# Patient Record
Sex: Female | Born: 1940 | Race: White | Hispanic: No | Smoking: Never smoker
Health system: Southern US, Community
[De-identification: ages and names within clinical notes are randomized; demographics above are authoritative.]

## PROBLEM LIST (undated history)

## (undated) DIAGNOSIS — G20A1 Parkinson's disease without dyskinesia, without mention of fluctuations: Secondary | ICD-10-CM

## (undated) DIAGNOSIS — R251 Tremor, unspecified: Secondary | ICD-10-CM

## (undated) DIAGNOSIS — C4491 Basal cell carcinoma of skin, unspecified: Secondary | ICD-10-CM

## (undated) DIAGNOSIS — I519 Heart disease, unspecified: Secondary | ICD-10-CM

## (undated) DIAGNOSIS — I10 Essential (primary) hypertension: Secondary | ICD-10-CM

## (undated) DIAGNOSIS — C50919 Malignant neoplasm of unspecified site of unspecified female breast: Secondary | ICD-10-CM

## (undated) DIAGNOSIS — Z923 Personal history of irradiation: Secondary | ICD-10-CM

## (undated) DIAGNOSIS — M5124 Other intervertebral disc displacement, thoracic region: Secondary | ICD-10-CM

## (undated) DIAGNOSIS — G20 Parkinson's disease: Secondary | ICD-10-CM

## (undated) DIAGNOSIS — I38 Endocarditis, valve unspecified: Secondary | ICD-10-CM

## (undated) HISTORY — DX: Essential (primary) hypertension: I10

## (undated) HISTORY — PX: TONSILLECTOMY: SUR1361

## (undated) HISTORY — DX: Tremor, unspecified: R25.1

## (undated) HISTORY — PX: BASAL CELL CARCINOMA EXCISION: SHX1214

## (undated) HISTORY — DX: Other intervertebral disc displacement, thoracic region: M51.24

## (undated) HISTORY — DX: Parkinson's disease without dyskinesia, without mention of fluctuations: G20.A1

## (undated) HISTORY — DX: Parkinson's disease: G20

## (undated) HISTORY — DX: Malignant neoplasm of unspecified site of unspecified female breast: C50.919

## (undated) HISTORY — PX: APPENDECTOMY: SHX54

## (undated) HISTORY — DX: Heart disease, unspecified: I51.9

## (undated) HISTORY — PX: CHOLECYSTECTOMY: SHX55

## (undated) HISTORY — DX: Basal cell carcinoma of skin, unspecified: C44.91

## (undated) HISTORY — DX: Endocarditis, valve unspecified: I38

## (undated) HISTORY — PX: ABDOMINAL HYSTERECTOMY: SHX81

---

## 1998-02-14 ENCOUNTER — Ambulatory Visit (HOSPITAL_COMMUNITY): Admission: RE | Admit: 1998-02-14 | Discharge: 1998-02-14 | Payer: Self-pay | Admitting: Obstetrics & Gynecology

## 2014-09-06 ENCOUNTER — Other Ambulatory Visit: Payer: Self-pay | Admitting: Internal Medicine

## 2014-09-06 DIAGNOSIS — Z1231 Encounter for screening mammogram for malignant neoplasm of breast: Secondary | ICD-10-CM

## 2014-09-13 ENCOUNTER — Ambulatory Visit
Admission: RE | Admit: 2014-09-13 | Discharge: 2014-09-13 | Disposition: A | Discharge status: Home / Self Care | Payer: Medicare Other | Source: Ambulatory Visit | Attending: Internal Medicine | Admitting: Internal Medicine

## 2014-09-13 DIAGNOSIS — Z1231 Encounter for screening mammogram for malignant neoplasm of breast: Secondary | ICD-10-CM | POA: Diagnosis present

## 2014-12-16 ENCOUNTER — Ambulatory Visit
Admission: RE | Admit: 2014-12-16 | Discharge: 2014-12-16 | Disposition: A | Discharge status: Home / Self Care | Payer: Medicare Other | Source: Ambulatory Visit | Attending: Internal Medicine | Admitting: Internal Medicine

## 2014-12-16 ENCOUNTER — Other Ambulatory Visit: Payer: Self-pay | Admitting: Internal Medicine

## 2014-12-16 DIAGNOSIS — M79604 Pain in right leg: Secondary | ICD-10-CM

## 2014-12-16 DIAGNOSIS — M79605 Pain in left leg: Principal | ICD-10-CM

## 2015-07-10 ENCOUNTER — Other Ambulatory Visit: Payer: Self-pay | Admitting: Internal Medicine

## 2015-07-10 DIAGNOSIS — Z1231 Encounter for screening mammogram for malignant neoplasm of breast: Secondary | ICD-10-CM

## 2015-09-14 ENCOUNTER — Ambulatory Visit
Admission: RE | Admit: 2015-09-14 | Discharge: 2015-09-14 | Disposition: A | Discharge status: Home / Self Care | Payer: Medicare Other | Source: Ambulatory Visit | Attending: Internal Medicine | Admitting: Internal Medicine

## 2015-09-14 ENCOUNTER — Other Ambulatory Visit: Payer: Self-pay | Admitting: Internal Medicine

## 2015-09-14 DIAGNOSIS — Z1231 Encounter for screening mammogram for malignant neoplasm of breast: Secondary | ICD-10-CM | POA: Diagnosis not present

## 2016-05-07 DIAGNOSIS — I10 Essential (primary) hypertension: Secondary | ICD-10-CM | POA: Diagnosis not present

## 2016-05-07 DIAGNOSIS — I2510 Atherosclerotic heart disease of native coronary artery without angina pectoris: Secondary | ICD-10-CM | POA: Diagnosis not present

## 2016-05-07 DIAGNOSIS — E782 Mixed hyperlipidemia: Secondary | ICD-10-CM | POA: Diagnosis not present

## 2016-05-07 DIAGNOSIS — I340 Nonrheumatic mitral (valve) insufficiency: Secondary | ICD-10-CM | POA: Diagnosis not present

## 2016-05-07 DIAGNOSIS — R0602 Shortness of breath: Secondary | ICD-10-CM | POA: Diagnosis not present

## 2016-05-07 DIAGNOSIS — I351 Nonrheumatic aortic (valve) insufficiency: Secondary | ICD-10-CM | POA: Diagnosis not present

## 2016-05-15 DIAGNOSIS — H524 Presbyopia: Secondary | ICD-10-CM | POA: Diagnosis not present

## 2016-05-22 ENCOUNTER — Other Ambulatory Visit: Payer: Self-pay | Admitting: Internal Medicine

## 2016-05-22 DIAGNOSIS — R05 Cough: Secondary | ICD-10-CM | POA: Diagnosis not present

## 2016-05-22 DIAGNOSIS — R5383 Other fatigue: Secondary | ICD-10-CM | POA: Diagnosis not present

## 2016-05-22 DIAGNOSIS — R061 Stridor: Secondary | ICD-10-CM | POA: Diagnosis not present

## 2016-05-22 DIAGNOSIS — J069 Acute upper respiratory infection, unspecified: Secondary | ICD-10-CM | POA: Diagnosis not present

## 2016-05-22 DIAGNOSIS — E785 Hyperlipidemia, unspecified: Secondary | ICD-10-CM | POA: Diagnosis not present

## 2016-05-22 DIAGNOSIS — Z1231 Encounter for screening mammogram for malignant neoplasm of breast: Secondary | ICD-10-CM

## 2016-05-22 DIAGNOSIS — E669 Obesity, unspecified: Secondary | ICD-10-CM | POA: Diagnosis not present

## 2016-05-22 DIAGNOSIS — I10 Essential (primary) hypertension: Secondary | ICD-10-CM | POA: Diagnosis not present

## 2016-05-27 DIAGNOSIS — R5383 Other fatigue: Secondary | ICD-10-CM | POA: Diagnosis not present

## 2016-07-11 ENCOUNTER — Other Ambulatory Visit: Payer: Self-pay | Admitting: Internal Medicine

## 2016-07-11 ENCOUNTER — Ambulatory Visit
Admission: RE | Admit: 2016-07-11 | Discharge: 2016-07-11 | Disposition: A | Discharge status: Home / Self Care | Payer: Medicare HMO | Source: Ambulatory Visit | Attending: Internal Medicine | Admitting: Internal Medicine

## 2016-07-11 DIAGNOSIS — R059 Cough, unspecified: Secondary | ICD-10-CM

## 2016-07-11 DIAGNOSIS — R05 Cough: Secondary | ICD-10-CM | POA: Insufficient documentation

## 2016-07-11 DIAGNOSIS — J069 Acute upper respiratory infection, unspecified: Secondary | ICD-10-CM | POA: Diagnosis not present

## 2016-07-11 DIAGNOSIS — J329 Chronic sinusitis, unspecified: Secondary | ICD-10-CM | POA: Diagnosis not present

## 2016-07-11 DIAGNOSIS — E669 Obesity, unspecified: Secondary | ICD-10-CM | POA: Diagnosis not present

## 2016-07-11 DIAGNOSIS — N390 Urinary tract infection, site not specified: Secondary | ICD-10-CM | POA: Diagnosis not present

## 2016-08-06 DIAGNOSIS — R69 Illness, unspecified: Secondary | ICD-10-CM | POA: Diagnosis not present

## 2016-09-23 DIAGNOSIS — G252 Other specified forms of tremor: Secondary | ICD-10-CM | POA: Diagnosis not present

## 2016-09-23 DIAGNOSIS — M8938 Hypertrophy of bone, other site: Secondary | ICD-10-CM | POA: Diagnosis not present

## 2016-09-23 DIAGNOSIS — M47816 Spondylosis without myelopathy or radiculopathy, lumbar region: Secondary | ICD-10-CM | POA: Diagnosis not present

## 2016-09-23 DIAGNOSIS — J069 Acute upper respiratory infection, unspecified: Secondary | ICD-10-CM | POA: Diagnosis not present

## 2016-09-23 DIAGNOSIS — M25551 Pain in right hip: Secondary | ICD-10-CM | POA: Diagnosis not present

## 2016-09-23 DIAGNOSIS — M2550 Pain in unspecified joint: Secondary | ICD-10-CM | POA: Diagnosis not present

## 2016-09-23 DIAGNOSIS — M545 Low back pain: Secondary | ICD-10-CM | POA: Diagnosis not present

## 2016-09-23 DIAGNOSIS — R760 Raised antibody titer: Secondary | ICD-10-CM | POA: Diagnosis not present

## 2016-09-23 DIAGNOSIS — I10 Essential (primary) hypertension: Secondary | ICD-10-CM | POA: Diagnosis not present

## 2016-09-23 DIAGNOSIS — M5136 Other intervertebral disc degeneration, lumbar region: Secondary | ICD-10-CM | POA: Diagnosis not present

## 2016-09-23 DIAGNOSIS — R69 Illness, unspecified: Secondary | ICD-10-CM | POA: Diagnosis not present

## 2016-09-25 ENCOUNTER — Ambulatory Visit
Admission: RE | Admit: 2016-09-25 | Discharge: 2016-09-25 | Disposition: A | Discharge status: Home / Self Care | Payer: Medicare HMO | Source: Ambulatory Visit | Attending: Internal Medicine | Admitting: Internal Medicine

## 2016-09-25 ENCOUNTER — Other Ambulatory Visit: Payer: Self-pay | Admitting: Internal Medicine

## 2016-09-25 DIAGNOSIS — Z1231 Encounter for screening mammogram for malignant neoplasm of breast: Secondary | ICD-10-CM | POA: Diagnosis not present

## 2016-10-07 DIAGNOSIS — M5416 Radiculopathy, lumbar region: Secondary | ICD-10-CM | POA: Diagnosis not present

## 2016-10-16 DIAGNOSIS — I10 Essential (primary) hypertension: Secondary | ICD-10-CM | POA: Diagnosis not present

## 2016-10-16 DIAGNOSIS — E669 Obesity, unspecified: Secondary | ICD-10-CM | POA: Diagnosis not present

## 2016-10-16 DIAGNOSIS — R768 Other specified abnormal immunological findings in serum: Secondary | ICD-10-CM | POA: Insufficient documentation

## 2016-10-16 DIAGNOSIS — E66811 Obesity, class 1: Secondary | ICD-10-CM | POA: Insufficient documentation

## 2016-10-16 DIAGNOSIS — E785 Hyperlipidemia, unspecified: Secondary | ICD-10-CM | POA: Diagnosis not present

## 2016-10-16 DIAGNOSIS — M5137 Other intervertebral disc degeneration, lumbosacral region: Secondary | ICD-10-CM | POA: Diagnosis not present

## 2016-11-04 DIAGNOSIS — I351 Nonrheumatic aortic (valve) insufficiency: Secondary | ICD-10-CM | POA: Diagnosis not present

## 2016-11-04 DIAGNOSIS — I340 Nonrheumatic mitral (valve) insufficiency: Secondary | ICD-10-CM | POA: Diagnosis not present

## 2016-11-04 DIAGNOSIS — I2510 Atherosclerotic heart disease of native coronary artery without angina pectoris: Secondary | ICD-10-CM | POA: Diagnosis not present

## 2016-11-04 DIAGNOSIS — I10 Essential (primary) hypertension: Secondary | ICD-10-CM | POA: Diagnosis not present

## 2016-11-04 DIAGNOSIS — E782 Mixed hyperlipidemia: Secondary | ICD-10-CM | POA: Diagnosis not present

## 2016-11-04 DIAGNOSIS — R0602 Shortness of breath: Secondary | ICD-10-CM | POA: Diagnosis not present

## 2016-12-05 DIAGNOSIS — R69 Illness, unspecified: Secondary | ICD-10-CM | POA: Diagnosis not present

## 2017-01-01 DIAGNOSIS — R109 Unspecified abdominal pain: Secondary | ICD-10-CM | POA: Diagnosis not present

## 2017-01-01 DIAGNOSIS — E785 Hyperlipidemia, unspecified: Secondary | ICD-10-CM | POA: Diagnosis not present

## 2017-01-01 DIAGNOSIS — M1990 Unspecified osteoarthritis, unspecified site: Secondary | ICD-10-CM | POA: Diagnosis not present

## 2017-01-01 DIAGNOSIS — I10 Essential (primary) hypertension: Secondary | ICD-10-CM | POA: Diagnosis not present

## 2017-01-01 DIAGNOSIS — R5383 Other fatigue: Secondary | ICD-10-CM | POA: Diagnosis not present

## 2017-01-06 DIAGNOSIS — E785 Hyperlipidemia, unspecified: Secondary | ICD-10-CM | POA: Diagnosis not present

## 2017-01-06 DIAGNOSIS — R5383 Other fatigue: Secondary | ICD-10-CM | POA: Diagnosis not present

## 2017-01-06 DIAGNOSIS — I10 Essential (primary) hypertension: Secondary | ICD-10-CM | POA: Diagnosis not present

## 2017-02-11 DIAGNOSIS — R69 Illness, unspecified: Secondary | ICD-10-CM | POA: Diagnosis not present

## 2017-04-03 DIAGNOSIS — D2272 Melanocytic nevi of left lower limb, including hip: Secondary | ICD-10-CM | POA: Diagnosis not present

## 2017-04-03 DIAGNOSIS — L821 Other seborrheic keratosis: Secondary | ICD-10-CM | POA: Diagnosis not present

## 2017-04-03 DIAGNOSIS — D225 Melanocytic nevi of trunk: Secondary | ICD-10-CM | POA: Diagnosis not present

## 2017-04-03 DIAGNOSIS — D2261 Melanocytic nevi of right upper limb, including shoulder: Secondary | ICD-10-CM | POA: Diagnosis not present

## 2017-04-21 DIAGNOSIS — E669 Obesity, unspecified: Secondary | ICD-10-CM | POA: Diagnosis not present

## 2017-04-21 DIAGNOSIS — M5137 Other intervertebral disc degeneration, lumbosacral region: Secondary | ICD-10-CM | POA: Diagnosis not present

## 2017-04-21 DIAGNOSIS — R768 Other specified abnormal immunological findings in serum: Secondary | ICD-10-CM | POA: Diagnosis not present

## 2017-04-30 DIAGNOSIS — Z833 Family history of diabetes mellitus: Secondary | ICD-10-CM | POA: Diagnosis not present

## 2017-04-30 DIAGNOSIS — Z825 Family history of asthma and other chronic lower respiratory diseases: Secondary | ICD-10-CM | POA: Diagnosis not present

## 2017-04-30 DIAGNOSIS — Z8249 Family history of ischemic heart disease and other diseases of the circulatory system: Secondary | ICD-10-CM | POA: Diagnosis not present

## 2017-04-30 DIAGNOSIS — Z7982 Long term (current) use of aspirin: Secondary | ICD-10-CM | POA: Diagnosis not present

## 2017-04-30 DIAGNOSIS — I10 Essential (primary) hypertension: Secondary | ICD-10-CM | POA: Diagnosis not present

## 2017-04-30 DIAGNOSIS — Z6836 Body mass index (BMI) 36.0-36.9, adult: Secondary | ICD-10-CM | POA: Diagnosis not present

## 2017-04-30 DIAGNOSIS — E785 Hyperlipidemia, unspecified: Secondary | ICD-10-CM | POA: Diagnosis not present

## 2017-04-30 DIAGNOSIS — E669 Obesity, unspecified: Secondary | ICD-10-CM | POA: Diagnosis not present

## 2017-05-09 DIAGNOSIS — E785 Hyperlipidemia, unspecified: Secondary | ICD-10-CM | POA: Diagnosis not present

## 2017-05-09 DIAGNOSIS — I10 Essential (primary) hypertension: Secondary | ICD-10-CM | POA: Diagnosis not present

## 2017-05-14 DIAGNOSIS — I10 Essential (primary) hypertension: Secondary | ICD-10-CM | POA: Diagnosis not present

## 2017-05-14 DIAGNOSIS — E663 Overweight: Secondary | ICD-10-CM | POA: Diagnosis not present

## 2017-05-14 DIAGNOSIS — E785 Hyperlipidemia, unspecified: Secondary | ICD-10-CM | POA: Diagnosis not present

## 2017-05-14 DIAGNOSIS — M1990 Unspecified osteoarthritis, unspecified site: Secondary | ICD-10-CM | POA: Diagnosis not present

## 2017-05-15 ENCOUNTER — Other Ambulatory Visit: Payer: Self-pay | Admitting: Nurse Practitioner

## 2017-05-15 DIAGNOSIS — Z1231 Encounter for screening mammogram for malignant neoplasm of breast: Secondary | ICD-10-CM

## 2017-05-19 DIAGNOSIS — H524 Presbyopia: Secondary | ICD-10-CM | POA: Diagnosis not present

## 2017-05-21 DIAGNOSIS — H524 Presbyopia: Secondary | ICD-10-CM | POA: Diagnosis not present

## 2017-06-02 DIAGNOSIS — I2510 Atherosclerotic heart disease of native coronary artery without angina pectoris: Secondary | ICD-10-CM | POA: Diagnosis not present

## 2017-06-02 DIAGNOSIS — I340 Nonrheumatic mitral (valve) insufficiency: Secondary | ICD-10-CM | POA: Diagnosis not present

## 2017-06-02 DIAGNOSIS — I351 Nonrheumatic aortic (valve) insufficiency: Secondary | ICD-10-CM | POA: Diagnosis not present

## 2017-09-09 DIAGNOSIS — R69 Illness, unspecified: Secondary | ICD-10-CM | POA: Diagnosis not present

## 2017-10-13 DIAGNOSIS — R5383 Other fatigue: Secondary | ICD-10-CM | POA: Diagnosis not present

## 2017-10-13 DIAGNOSIS — I10 Essential (primary) hypertension: Secondary | ICD-10-CM | POA: Diagnosis not present

## 2017-10-13 DIAGNOSIS — E785 Hyperlipidemia, unspecified: Secondary | ICD-10-CM | POA: Diagnosis not present

## 2017-10-13 DIAGNOSIS — E669 Obesity, unspecified: Secondary | ICD-10-CM | POA: Diagnosis not present

## 2017-10-16 DIAGNOSIS — I10 Essential (primary) hypertension: Secondary | ICD-10-CM | POA: Diagnosis not present

## 2017-10-16 DIAGNOSIS — E785 Hyperlipidemia, unspecified: Secondary | ICD-10-CM | POA: Diagnosis not present

## 2017-10-16 DIAGNOSIS — M1990 Unspecified osteoarthritis, unspecified site: Secondary | ICD-10-CM | POA: Diagnosis not present

## 2017-10-16 DIAGNOSIS — Z0001 Encounter for general adult medical examination with abnormal findings: Secondary | ICD-10-CM | POA: Diagnosis not present

## 2017-10-16 DIAGNOSIS — G603 Idiopathic progressive neuropathy: Secondary | ICD-10-CM | POA: Diagnosis not present

## 2017-10-16 DIAGNOSIS — R5383 Other fatigue: Secondary | ICD-10-CM | POA: Diagnosis not present

## 2017-10-16 DIAGNOSIS — E559 Vitamin D deficiency, unspecified: Secondary | ICD-10-CM | POA: Diagnosis not present

## 2017-10-27 ENCOUNTER — Ambulatory Visit
Admission: RE | Admit: 2017-10-27 | Discharge: 2017-10-27 | Disposition: A | Discharge status: Home / Self Care | Payer: Medicare HMO | Source: Ambulatory Visit | Attending: Nurse Practitioner | Admitting: Nurse Practitioner

## 2017-10-27 ENCOUNTER — Other Ambulatory Visit: Payer: Self-pay | Admitting: Nurse Practitioner

## 2017-10-27 DIAGNOSIS — Z1231 Encounter for screening mammogram for malignant neoplasm of breast: Secondary | ICD-10-CM | POA: Diagnosis not present

## 2017-10-29 DIAGNOSIS — R2681 Unsteadiness on feet: Secondary | ICD-10-CM | POA: Diagnosis not present

## 2017-10-29 DIAGNOSIS — R262 Difficulty in walking, not elsewhere classified: Secondary | ICD-10-CM | POA: Diagnosis not present

## 2017-11-03 DIAGNOSIS — R262 Difficulty in walking, not elsewhere classified: Secondary | ICD-10-CM | POA: Diagnosis not present

## 2017-11-03 DIAGNOSIS — R2681 Unsteadiness on feet: Secondary | ICD-10-CM | POA: Diagnosis not present

## 2017-11-06 DIAGNOSIS — R262 Difficulty in walking, not elsewhere classified: Secondary | ICD-10-CM | POA: Diagnosis not present

## 2017-11-06 DIAGNOSIS — R2681 Unsteadiness on feet: Secondary | ICD-10-CM | POA: Diagnosis not present

## 2017-11-10 DIAGNOSIS — R262 Difficulty in walking, not elsewhere classified: Secondary | ICD-10-CM | POA: Diagnosis not present

## 2017-11-10 DIAGNOSIS — R2681 Unsteadiness on feet: Secondary | ICD-10-CM | POA: Diagnosis not present

## 2017-11-13 DIAGNOSIS — R2681 Unsteadiness on feet: Secondary | ICD-10-CM | POA: Diagnosis not present

## 2017-11-13 DIAGNOSIS — R262 Difficulty in walking, not elsewhere classified: Secondary | ICD-10-CM | POA: Diagnosis not present

## 2017-11-18 DIAGNOSIS — R2681 Unsteadiness on feet: Secondary | ICD-10-CM | POA: Diagnosis not present

## 2017-11-18 DIAGNOSIS — R262 Difficulty in walking, not elsewhere classified: Secondary | ICD-10-CM | POA: Diagnosis not present

## 2017-11-20 DIAGNOSIS — R2681 Unsteadiness on feet: Secondary | ICD-10-CM | POA: Diagnosis not present

## 2017-11-20 DIAGNOSIS — R262 Difficulty in walking, not elsewhere classified: Secondary | ICD-10-CM | POA: Diagnosis not present

## 2017-11-24 DIAGNOSIS — R262 Difficulty in walking, not elsewhere classified: Secondary | ICD-10-CM | POA: Diagnosis not present

## 2017-11-24 DIAGNOSIS — R2681 Unsteadiness on feet: Secondary | ICD-10-CM | POA: Diagnosis not present

## 2018-01-02 DIAGNOSIS — R69 Illness, unspecified: Secondary | ICD-10-CM | POA: Diagnosis not present

## 2018-01-27 DIAGNOSIS — M25551 Pain in right hip: Secondary | ICD-10-CM | POA: Diagnosis not present

## 2018-01-27 DIAGNOSIS — E785 Hyperlipidemia, unspecified: Secondary | ICD-10-CM | POA: Diagnosis not present

## 2018-01-27 DIAGNOSIS — I10 Essential (primary) hypertension: Secondary | ICD-10-CM | POA: Diagnosis not present

## 2018-01-27 DIAGNOSIS — M5136 Other intervertebral disc degeneration, lumbar region: Secondary | ICD-10-CM | POA: Diagnosis not present

## 2018-02-03 DIAGNOSIS — M545 Low back pain: Secondary | ICD-10-CM | POA: Diagnosis not present

## 2018-03-02 DIAGNOSIS — M545 Low back pain: Secondary | ICD-10-CM | POA: Diagnosis not present

## 2018-03-05 DIAGNOSIS — M5416 Radiculopathy, lumbar region: Secondary | ICD-10-CM | POA: Diagnosis not present

## 2018-03-05 DIAGNOSIS — M5136 Other intervertebral disc degeneration, lumbar region: Secondary | ICD-10-CM | POA: Diagnosis not present

## 2018-03-06 DIAGNOSIS — I10 Essential (primary) hypertension: Secondary | ICD-10-CM | POA: Diagnosis not present

## 2018-03-06 DIAGNOSIS — E782 Mixed hyperlipidemia: Secondary | ICD-10-CM | POA: Diagnosis not present

## 2018-03-06 DIAGNOSIS — I340 Nonrheumatic mitral (valve) insufficiency: Secondary | ICD-10-CM | POA: Diagnosis not present

## 2018-03-06 DIAGNOSIS — I2510 Atherosclerotic heart disease of native coronary artery without angina pectoris: Secondary | ICD-10-CM | POA: Diagnosis not present

## 2018-03-06 DIAGNOSIS — I351 Nonrheumatic aortic (valve) insufficiency: Secondary | ICD-10-CM | POA: Diagnosis not present

## 2018-04-06 ENCOUNTER — Telehealth: Payer: Self-pay | Admitting: Neurology

## 2018-04-06 ENCOUNTER — Ambulatory Visit: Payer: Medicare HMO | Admitting: Neurology

## 2018-04-06 ENCOUNTER — Encounter: Payer: Self-pay | Admitting: Neurology

## 2018-04-06 ENCOUNTER — Encounter

## 2018-04-06 DIAGNOSIS — G20 Parkinson's disease: Secondary | ICD-10-CM

## 2018-04-06 DIAGNOSIS — G20A1 Parkinson's disease without dyskinesia, without mention of fluctuations: Secondary | ICD-10-CM | POA: Insufficient documentation

## 2018-04-06 NOTE — Progress Notes (Signed)
PATIENT: Sheila Mitchell DOB: 10/25/1940  Chief Complaint  Patient presents with  . R/O Parkinson's Disease    Reports mild, right hand tremor and gait difficulty.  She is having problems writing.  She thinks her walking issue is due to a recently diagnosed ruptured disc.  She is scheduled to have an ESI next week.   Marland Kitchen PCP    Danelle Berry, NP     HISTORICAL  Sheila Mitchell is a 78 years old female, seen in request by her primary care NP Evern Bio for evaluation of Parkinson's disease.  Initial evaluation was on April 06, 2018  I have reviewed and summarized the referring note from the referring physician.  She has past medical history of hypertension, hyperlipidemia.  Patient reported a history of right hand tremor since summer 2019, smaller print with prolonged writing, she also noticed mild gait difficulty, difficulty initiate gait when first get up, unsteady gait, dragging her right leg across the floor, initially attributed to her hip, low back problem, had physical therapy which was helpful, also reported MRI of lumbar and thoracic spine by [facility] orthopedic surgeon reviewed degenerative disc disease, is planning on to have epidural injection.  She had decreased sense of smell for more than 10 years, denied constipation, occasionally orthostatic dizziness, tends to restless when sleep,  REVIEW OF SYSTEMS: Full 14 system review of systems performed and notable only for numbness, allergy, runny nose, snoring All other review of systems were negative.  ALLERGIES: No Known Allergies  HOME MEDICATIONS: Current Outpatient Medications  Medication Sig Dispense Refill  . furosemide (LASIX) 20 MG tablet Take 20 mg by mouth daily.    Marland Kitchen ketotifen (ZADITOR) 0.025 % ophthalmic solution Place 1 drop into both eyes daily.    Marland Kitchen lisinopril (PRINIVIL,ZESTRIL) 5 MG tablet Take 5 mg by mouth daily.    . simvastatin (ZOCOR) 20 MG tablet Take 20 mg by mouth daily.     No  current facility-administered medications for this visit.     PAST MEDICAL HISTORY: Past Medical History:  Diagnosis Date  . Basal cell carcinoma   . Heart disease   . Hypertension   . Ruptured disc, thoracic   . Tremor     PAST SURGICAL HISTORY: Past Surgical History:  Procedure Laterality Date  . ABDOMINAL HYSTERECTOMY    . BASAL CELL CARCINOMA EXCISION     nose  . CHOLECYSTECTOMY    . TONSILLECTOMY      FAMILY HISTORY: Family History  Problem Relation Age of Onset  . COPD Mother   . Heart disease Father   . Diabetes Paternal Grandmother   . Breast cancer Neg Hx     SOCIAL HISTORY: Social History   Socioeconomic History  . Marital status: Widowed    Spouse name: Not on file  . Number of children: 3  . Years of education: one year college  . Highest education level: Not on file  Occupational History  . Occupation: Retired  Scientific laboratory technician  . Financial resource strain: Not on file  . Food insecurity:    Worry: Not on file    Inability: Not on file  . Transportation needs:    Medical: Not on file    Non-medical: Not on file  Tobacco Use  . Smoking status: Never Smoker  . Smokeless tobacco: Never Used  Substance and Sexual Activity  . Alcohol use: Yes    Comment: occasional use  . Drug use: Never  . Sexual activity:  Not on file  Lifestyle  . Physical activity:    Days per week: Not on file    Minutes per session: Not on file  . Stress: Not on file  Relationships  . Social connections:    Talks on phone: Not on file    Gets together: Not on file    Attends religious service: Not on file    Active member of club or organization: Not on file    Attends meetings of clubs or organizations: Not on file    Relationship status: Not on file  . Intimate partner violence:    Fear of current or ex partner: Not on file    Emotionally abused: Not on file    Physically abused: Not on file    Forced sexual activity: Not on file  Other Topics Concern  . Not on  file  Social History Narrative   Lives at home alone.   Right-handed.   2 cups caffeine daily.     PHYSICAL EXAM   Vitals:   04/06/18 1324  BP: 137/74  Pulse: 70  Weight: 186 lb 8 oz (84.6 kg)  Height: 5' 1.5" (1.562 m)    Not recorded      Body mass index is 34.67 kg/m.  PHYSICAL EXAMNIATION:  Gen: NAD, conversant, well nourised, obese, well groomed                     Cardiovascular: Regular rate rhythm, no peripheral edema, warm, nontender. Eyes: Conjunctivae clear without exudates or hemorrhage Neck: Supple, no carotid bruits. Pulmonary: Clear to auscultation bilaterally   NEUROLOGICAL EXAM:  MENTAL STATUS: Speech:    Speech is normal; fluent and spontaneous with normal comprehension.  Cognition:     Orientation to time, place and person     Normal recent and remote memory     Normal Attention span and concentration     Normal Language, naming, repeating,spontaneous speech     Fund of knowledge   CRANIAL NERVES: CN II: Visual fields are full to confrontation. Pupils are round equal and briskly reactive to light. CN III, IV, VI: extraocular movement are normal. No ptosis. CN V: Facial sensation is intact to pinprick in all 3 divisions bilaterally. Corneal responses are intact.  CN VII: Face is symmetric with normal eye closure and smile. CN VIII: Hearing is normal to rubbing fingers CN IX, X: Palate elevates symmetrically. Phonation is normal. CN XI: Head turning and shoulder shrug are intact CN XII: Tongue is midline with normal movements and no atrophy.  MOTOR: Mild right hand resting tremor, mild right more than left rigidity, bradykinesia, no weakness  REFLEXES: Reflexes are 2+ and symmetric at the biceps, triceps, knees, and ankles. Plantar responses are flexor.  SENSORY: Intact to light touch, pinprick, positional sensation and vibratory sensation are intact in fingers and toes.  COORDINATION: Rapid alternating movements and fine finger  movements are intact. There is no dysmetria on finger-to-nose and heel-knee-shin.    GAIT/STANCE: She can get up from seated position arms crossed, decreased bilateral arm swing, right worse than left, mild retropulsion stability.   DIAGNOSTIC DATA (LABS, IMAGING, TESTING) - I reviewed patient records, labs, notes, testing and imaging myself where available.   ASSESSMENT AND PLAN  Sheila Mitchell is a 78 y.o. female   Idiopathic Parkinson's disease  Complete evaluation with MRI of the brain  I encouraged her moderate exercise     Marcial Pacas, M.D. Ph.D.  [facility] [phone]  [address], [place], [place] [address] Ph: [phone] Fax: [phone]  CC: Danelle Berry, NP

## 2018-04-06 NOTE — Telephone Encounter (Signed)
[employer] medicare order sent to [facility] l[facility] for pt to be aware. I left [facility] phone number of [phone] and to give them a call if she has not heard from them in the next 2-3 business days.

## 2018-04-08 DIAGNOSIS — M5416 Radiculopathy, lumbar region: Secondary | ICD-10-CM | POA: Diagnosis not present

## 2018-04-10 DIAGNOSIS — R5383 Other fatigue: Secondary | ICD-10-CM | POA: Diagnosis not present

## 2018-04-10 DIAGNOSIS — E559 Vitamin D deficiency, unspecified: Secondary | ICD-10-CM | POA: Diagnosis not present

## 2018-04-10 DIAGNOSIS — E785 Hyperlipidemia, unspecified: Secondary | ICD-10-CM | POA: Diagnosis not present

## 2018-04-10 DIAGNOSIS — I10 Essential (primary) hypertension: Secondary | ICD-10-CM | POA: Diagnosis not present

## 2018-04-10 DIAGNOSIS — G603 Idiopathic progressive neuropathy: Secondary | ICD-10-CM | POA: Diagnosis not present

## 2018-04-13 DIAGNOSIS — R69 Illness, unspecified: Secondary | ICD-10-CM | POA: Diagnosis not present

## 2018-04-15 ENCOUNTER — Telehealth: Payer: Self-pay | Admitting: Neurology

## 2018-04-15 DIAGNOSIS — G20 Parkinson's disease: Secondary | ICD-10-CM | POA: Diagnosis not present

## 2018-04-15 DIAGNOSIS — J309 Allergic rhinitis, unspecified: Secondary | ICD-10-CM | POA: Diagnosis not present

## 2018-04-15 DIAGNOSIS — I351 Nonrheumatic aortic (valve) insufficiency: Secondary | ICD-10-CM | POA: Diagnosis not present

## 2018-04-15 DIAGNOSIS — E782 Mixed hyperlipidemia: Secondary | ICD-10-CM | POA: Diagnosis not present

## 2018-04-15 DIAGNOSIS — I10 Essential (primary) hypertension: Secondary | ICD-10-CM | POA: Diagnosis not present

## 2018-04-15 NOTE — Telephone Encounter (Signed)
I was able to review MRI of lumbar spine in December 2019, L2-3, right subarticular disc extrusion compressed the descending right L3 nerve roots, multilevel degenerative changes at multiple levels,  She was given muscle relaxant, prednisone,

## 2018-04-20 NOTE — Telephone Encounter (Signed)
Yisroel Ramming: [id number] (exp. 04/17/18 to 07/16/18) patient is scheduled at GI for 04/22/18

## 2018-04-22 ENCOUNTER — Ambulatory Visit
Admission: RE | Admit: 2018-04-22 | Discharge: 2018-04-22 | Disposition: A | Discharge status: Home / Self Care | Payer: Medicare HMO | Source: Ambulatory Visit | Attending: Neurology | Admitting: Neurology

## 2018-04-22 DIAGNOSIS — G20 Parkinson's disease: Secondary | ICD-10-CM | POA: Diagnosis not present

## 2018-04-23 DIAGNOSIS — M5416 Radiculopathy, lumbar region: Secondary | ICD-10-CM | POA: Diagnosis not present

## 2018-04-24 ENCOUNTER — Telehealth: Payer: Self-pay | Admitting: Neurology

## 2018-04-24 NOTE — Telephone Encounter (Signed)
Spoke to patient.  She is aware of the MRI results and verbalize understanding.

## 2018-04-24 NOTE — Telephone Encounter (Signed)
Please call patient, MRI of brain showed no significant abnormalities.

## 2018-05-21 DIAGNOSIS — H524 Presbyopia: Secondary | ICD-10-CM | POA: Diagnosis not present

## 2018-05-23 DIAGNOSIS — R69 Illness, unspecified: Secondary | ICD-10-CM | POA: Diagnosis not present

## 2018-06-29 ENCOUNTER — Telehealth: Payer: Self-pay | Admitting: Neurology

## 2018-06-29 NOTE — Telephone Encounter (Signed)
Pt called and consented to a Virtual Visit and for the insurance to be billed as such. E-mail has been confirmed.

## 2018-06-29 NOTE — Telephone Encounter (Signed)
Patient was sent a email with instructions on how to setup her webex meeting.

## 2018-07-08 ENCOUNTER — Other Ambulatory Visit: Payer: Self-pay

## 2018-07-08 ENCOUNTER — Encounter: Payer: Self-pay | Admitting: Neurology

## 2018-07-08 ENCOUNTER — Ambulatory Visit (INDEPENDENT_AMBULATORY_CARE_PROVIDER_SITE_OTHER): Payer: Medicare HMO | Admitting: Neurology

## 2018-07-08 DIAGNOSIS — G20 Parkinson's disease: Secondary | ICD-10-CM

## 2018-07-08 NOTE — Progress Notes (Signed)
Virtual Visit via Video Note  I connected with Sheila Mitchell on 07/08/18 at  2:15 PM EDT by a video enabled telemedicine application and verified that I am speaking with the correct person using two identifiers.   I discussed the limitations of evaluation and management by telemedicine and the availability of in person appointments. The patient expressed understanding and agreed to proceed.  History of Present Illness: 04/06/2018 YY: Sheila Mitchell is a 78 years old female, seen in request by her primary care NP Evern Bio for evaluation of Parkinson's disease.  Initial evaluation was on April 06, 2018  I have reviewed and summarized the referring note from the referring physician.  She has past medical history of hypertension, hyperlipidemia.  Patient reported a history of right hand tremor since summer 2019, smaller print with prolonged writing, she also noticed mild gait difficulty, difficulty initiate gait when first get up, unsteady gait, dragging her right leg across the floor, initially attributed to her hip, low back problem, had physical therapy which was helpful, also reported MRI of lumbar and thoracic spine by [facility] orthopedic surgeon reviewed degenerative disc disease, is planning on to have epidural injection.  She had decreased sense of smell for more than 10 years, denied constipation, occasionally orthostatic dizziness, tends to restless when sleep,  July 08 2018 SS: She had MRI of the brain in February 2020, showed no significant abnormalities, symptoms of PD include: tremor of right hand, gait difficulty, decreased sense of smell, restless sleeping   She is doing very well.  She reports she joined a Parkinson's disease exercise group, she goes 3 times a week to do boxing, exercise.  She has been lifting weights.  She thinks this has improved her walking, her balance.  She denies any falls.  She reports she will have intermittent tremor in her right hand,  does not impact her daily life.  She has been trying to eat healthy, has lost a few pounds, is doing Weight Watchers.  She does live alone, her children are nearby.  In January she had a ruptured disc, had an epidural steroid injection, took care of the problem.  She denies any problems swallowing, she reports she has a good appetite.  She reports she is sleeping well, sleeping about 8 hours a night. She does her own ADLs, is very active, independent.   Observations/Objective: Alert, answers questions appropriately, speech is clear and concise follows commands, facial symmetry noted no masking of the face, no arm drift, gait is intact, is mildly cautious  Assessment and Plan: 1. Idiopathic Parkinson's Disease  Overall she is doing very well.  She has joined a Parkinson's disease exercise group.  This has greatly benefited her.  She will continue staying active and eating healthy.  We discussed PD and signs of progression.  She seems to be a true go-getter, is handling her diagnosis very well and has good support system. No need for PD medications at this time.   Follow Up Instructions: 6 months    I discussed the assessment and treatment plan with the patient. The patient was provided an opportunity to ask questions and all were answered. The patient agreed with the plan and demonstrated an understanding of the instructions.   The patient was advised to call back or seek an in-person evaluation if the symptoms worsen or if the condition fails to improve as anticipated.  I provided 20 minutes of non-face-to-face time during this encounter.   Butler Denmark, AGNP-C,  DNP  [facility] [address] [place], [place] [address] [phone]

## 2018-07-08 NOTE — Progress Notes (Signed)
I have reviewed and agreed above plan. 

## 2018-09-30 ENCOUNTER — Other Ambulatory Visit: Payer: Self-pay | Admitting: Nurse Practitioner

## 2018-09-30 DIAGNOSIS — Z1231 Encounter for screening mammogram for malignant neoplasm of breast: Secondary | ICD-10-CM

## 2018-10-15 DIAGNOSIS — I10 Essential (primary) hypertension: Secondary | ICD-10-CM | POA: Diagnosis not present

## 2018-10-15 DIAGNOSIS — E782 Mixed hyperlipidemia: Secondary | ICD-10-CM | POA: Diagnosis not present

## 2018-10-20 DIAGNOSIS — M1990 Unspecified osteoarthritis, unspecified site: Secondary | ICD-10-CM | POA: Diagnosis not present

## 2018-10-20 DIAGNOSIS — G603 Idiopathic progressive neuropathy: Secondary | ICD-10-CM | POA: Diagnosis not present

## 2018-10-20 DIAGNOSIS — R5383 Other fatigue: Secondary | ICD-10-CM | POA: Diagnosis not present

## 2018-10-20 DIAGNOSIS — G20 Parkinson's disease: Secondary | ICD-10-CM | POA: Diagnosis not present

## 2018-10-20 DIAGNOSIS — E782 Mixed hyperlipidemia: Secondary | ICD-10-CM | POA: Diagnosis not present

## 2018-10-20 DIAGNOSIS — I10 Essential (primary) hypertension: Secondary | ICD-10-CM | POA: Diagnosis not present

## 2018-10-20 DIAGNOSIS — E785 Hyperlipidemia, unspecified: Secondary | ICD-10-CM | POA: Diagnosis not present

## 2018-10-20 DIAGNOSIS — E559 Vitamin D deficiency, unspecified: Secondary | ICD-10-CM | POA: Diagnosis not present

## 2018-11-12 ENCOUNTER — Ambulatory Visit
Admission: RE | Admit: 2018-11-12 | Discharge: 2018-11-12 | Disposition: A | Discharge status: Home / Self Care | Payer: Medicare HMO | Source: Ambulatory Visit | Attending: Nurse Practitioner | Admitting: Nurse Practitioner

## 2018-11-12 DIAGNOSIS — Z1231 Encounter for screening mammogram for malignant neoplasm of breast: Secondary | ICD-10-CM | POA: Insufficient documentation

## 2018-11-24 DIAGNOSIS — R69 Illness, unspecified: Secondary | ICD-10-CM | POA: Diagnosis not present

## 2018-11-26 DIAGNOSIS — M5416 Radiculopathy, lumbar region: Secondary | ICD-10-CM | POA: Diagnosis not present

## 2018-12-10 DIAGNOSIS — Z08 Encounter for follow-up examination after completed treatment for malignant neoplasm: Secondary | ICD-10-CM | POA: Diagnosis not present

## 2018-12-10 DIAGNOSIS — D2262 Melanocytic nevi of left upper limb, including shoulder: Secondary | ICD-10-CM | POA: Diagnosis not present

## 2018-12-10 DIAGNOSIS — D2271 Melanocytic nevi of right lower limb, including hip: Secondary | ICD-10-CM | POA: Diagnosis not present

## 2018-12-10 DIAGNOSIS — D2261 Melanocytic nevi of right upper limb, including shoulder: Secondary | ICD-10-CM | POA: Diagnosis not present

## 2018-12-10 DIAGNOSIS — D2272 Melanocytic nevi of left lower limb, including hip: Secondary | ICD-10-CM | POA: Diagnosis not present

## 2018-12-10 DIAGNOSIS — L821 Other seborrheic keratosis: Secondary | ICD-10-CM | POA: Diagnosis not present

## 2018-12-10 DIAGNOSIS — Z85828 Personal history of other malignant neoplasm of skin: Secondary | ICD-10-CM | POA: Diagnosis not present

## 2018-12-10 DIAGNOSIS — D225 Melanocytic nevi of trunk: Secondary | ICD-10-CM | POA: Diagnosis not present

## 2019-01-03 NOTE — Progress Notes (Signed)
PATIENT: Sheila Mitchell DOB: 1941/03/02  REASON FOR VISIT: follow up HISTORY FROM: patient  HISTORY OF PRESENT ILLNESS: Today 01/04/19  HISTORY  04/06/2018 YY: Sheila Mitchell a 79 years old female, seen in request byher primary care NPBoswell, Chelsafor evaluation of Parkinson's disease.Initial evaluation was on April 06, 2018  I have reviewed and summarized the referring note from the referring physician.She has past medical history of hypertension, hyperlipidemia.  Patient reported a history of right hand tremor since summer 2019, smaller print with prolonged writing, she also noticed mild gait difficulty, difficulty initiate gait when first get up, unsteady gait, dragging her right leg across the floor, initially attributed to her hip, low back problem, had physical therapy which was helpful, also reported MRI of lumbar and thoracic spine by [facility] orthopedic surgeon reviewed degenerative disc disease, is planning on to have epidural injection.  She had decreased sense of smell for more than 10 years, denied constipation, occasionally orthostatic dizziness, tends to restless when sleep,  July 08 2018 SS: She had MRI of the brain in February 2020, showed no significant abnormalities, symptoms of PD include: tremor of right hand, gait difficulty, decreased sense of smell, restless sleeping   She is doing very well.  She reports she joined a Parkinson's disease exercise group, she goes 3 times a week to do boxing, exercise.  She has been lifting weights.  She thinks this has improved her walking, her balance.  She denies any falls.  She reports she will have intermittent tremor in her right hand, does not impact her daily life.  She has been trying to eat healthy, has lost a few pounds, is doing Weight Watchers.  She does live alone, her children are nearby.  In January she had a ruptured disc, had an epidural steroid injection, took care of the problem.  She denies  any problems swallowing, she reports she has a good appetite.  She reports she is sleeping well, sleeping about 8 hours a night. She does her own ADLs, is very active, independent.  Update January 04, 2019 SS: She is here alone today.  She continues to do her PD exercise, boxing group 3 times a week for 1 hour.  She really loves this and feels it has improved her walking.  She does not notice her tremor much at all.  She may occasionally notice a resting tremor in the right or left hand, but goes away if she holds her hand. She feels her handwriting has improved.  She has not had any falls.  She says her sense of smell has improved.  She has lost about 15 pounds, is doing weight watchers and [employer].  She does indicate she has restless sleep in the evenings.  She denies any freezing episodes or trouble moving.  She does her doctor prescribed her something for sleep, but she did not pick it up.  MRI of the brain in the past has been unremarkable.  REVIEW OF SYSTEMS: Out of a complete 14 system review of symptoms, the patient complains only of the following symptoms, and all other reviewed systems are negative.  Tremor  ALLERGIES: No Known Allergies  HOME MEDICATIONS: Outpatient Medications Prior to Visit  Medication Sig Dispense Refill  . furosemide (LASIX) 20 MG tablet Take 20 mg by mouth daily.    Marland Kitchen lisinopril (PRINIVIL,ZESTRIL) 5 MG tablet Take 5 mg by mouth daily.    . Loratadine (CLARITIN PO) Take 1 tablet by mouth daily as needed.  OTC    . simvastatin (ZOCOR) 20 MG tablet Take 20 mg by mouth daily.    Marland Kitchen ketotifen (ZADITOR) 0.025 % ophthalmic solution Place 1 drop into both eyes daily.     No facility-administered medications prior to visit.     PAST MEDICAL HISTORY: Past Medical History:  Diagnosis Date  . Basal cell carcinoma   . Heart disease   . Hypertension   . Ruptured disc, thoracic   . Tremor     PAST SURGICAL HISTORY: Past Surgical History:  Procedure Laterality  Date  . ABDOMINAL HYSTERECTOMY    . BASAL CELL CARCINOMA EXCISION     nose  . CHOLECYSTECTOMY    . TONSILLECTOMY      FAMILY HISTORY: Family History  Problem Relation Age of Onset  . COPD Mother   . Heart disease Father   . Diabetes Paternal Grandmother   . Breast cancer Neg Hx     SOCIAL HISTORY: Social History   Socioeconomic History  . Marital status: Widowed    Spouse name: Not on file  . Number of children: 3  . Years of education: one year college  . Highest education level: Not on file  Occupational History  . Occupation: Retired  Scientific laboratory technician  . Financial resource strain: Not on file  . Food insecurity    Worry: Not on file    Inability: Not on file  . Transportation needs    Medical: Not on file    Non-medical: Not on file  Tobacco Use  . Smoking status: Never Smoker  . Smokeless tobacco: Never Used  Substance and Sexual Activity  . Alcohol use: Yes    Comment: occasional use  . Drug use: Never  . Sexual activity: Not on file  Lifestyle  . Physical activity    Days per week: Not on file    Minutes per session: Not on file  . Stress: Not on file  Relationships  . Social Herbalist on phone: Not on file    Gets together: Not on file    Attends religious service: Not on file    Active member of club or organization: Not on file    Attends meetings of clubs or organizations: Not on file    Relationship status: Not on file  . Intimate partner violence    Fear of current or ex partner: Not on file    Emotionally abused: Not on file    Physically abused: Not on file    Forced sexual activity: Not on file  Other Topics Concern  . Not on file  Social History Narrative   Lives at home alone.   Right-handed.   2 cups caffeine daily.   PHYSICAL EXAM  Vitals:   01/04/19 1425  BP: 127/75  Pulse: 80  Temp: (!) 95 F (35 C)  Weight: 175 lb 3.2 oz (79.5 kg)  Height: 5\' 2"  (1.575 m)   Body mass index is 32.04 kg/m.  Generalized: Well  developed, in no acute distress   Neurological examination  Mentation: Alert oriented to time, place, history taking. Follows all commands speech and language fluent Cranial nerve II-XII: Pupils were equal round reactive to light. Extraocular movements were full, visual field were full on confrontational test. Facial sensation and strength were normal. Head turning and shoulder shrug  were normal and symmetric. No masking of facies was seen.  Motor: The motor testing reveals 5 over 5 strength of all 4 extremities. Good symmetric motor  tone is noted throughout.  Mild resting tremor noted to both hands.  No bradykinesia noted or weakness.  Sensory: Sensory testing is intact to soft touch on all 4 extremities. No evidence of extinction is noted.  Coordination: Cerebellar testing reveals good finger-nose-finger and heel-to-shin bilaterally.  Gait and station: Able to rise from seated position without pushoff, gait is intact, good stride, good turns, tandem gait is unsteady. Reflexes: Deep tendon reflexes are symmetric and normal bilaterally.   DIAGNOSTIC DATA (LABS, IMAGING, TESTING) - I reviewed patient records, labs, notes, testing and imaging myself where available.  No results found for: WBC, HGB, HCT, MCV, PLT No results found for: NA, K, CL, CO2, GLUCOSE, BUN, CREATININE, CALCIUM, PROT, ALBUMIN, AST, ALT, ALKPHOS, BILITOT, GFRNONAA, GFRAA No results found for: CHOL, HDL, LDLCALC, LDLDIRECT, TRIG, CHOLHDL No results found for: HGBA1C No results found for: VITAMINB12 No results found for: TSH  ASSESSMENT AND PLAN 78 y.o. year old female  has a past medical history of Basal cell carcinoma, Heart disease, Hypertension, Ruptured disc, thoracic, and Tremor. here with:  1.  Idiopathic Parkinson's disease -Overall, continues to do quite well, mild resting tremor to both hands noted, gait is much more stable -MRI of the brain was unremarkable -Continue PD group exercise, boxing class 3 times a  week, healthy eating -No need for PD medications at this time -Some report of restless sleep in the evening, her primary was going to start her on something, but she can't remember the name -Return in 6 months or sooner if needed with Dr. Krista Blue   I spent 15 minutes with the patient. 50% of this time was spent discussing her plan of care.  Butler Denmark, AGNP-C, DNP 01/04/2019, 2:29 PM [facility] [address] [place], [place] [address] [phone]

## 2019-01-04 ENCOUNTER — Ambulatory Visit: Payer: Medicare HMO | Admitting: Neurology

## 2019-01-04 ENCOUNTER — Encounter: Payer: Self-pay | Admitting: Neurology

## 2019-01-04 ENCOUNTER — Other Ambulatory Visit: Payer: Self-pay

## 2019-01-04 VITALS — BP 127/75 | HR 80 | Temp 95.0°F | Ht 62.0 in | Wt 175.2 lb

## 2019-01-04 DIAGNOSIS — G20 Parkinson's disease: Secondary | ICD-10-CM | POA: Diagnosis not present

## 2019-01-04 NOTE — Patient Instructions (Signed)
1. Continue exercise program 2. Everything looks very good today!

## 2019-01-04 NOTE — Progress Notes (Signed)
I have reviewed and agreed above plan. 

## 2019-01-07 DIAGNOSIS — I340 Nonrheumatic mitral (valve) insufficiency: Secondary | ICD-10-CM | POA: Diagnosis not present

## 2019-01-07 DIAGNOSIS — I10 Essential (primary) hypertension: Secondary | ICD-10-CM | POA: Diagnosis not present

## 2019-01-07 DIAGNOSIS — E782 Mixed hyperlipidemia: Secondary | ICD-10-CM | POA: Diagnosis not present

## 2019-01-07 DIAGNOSIS — I351 Nonrheumatic aortic (valve) insufficiency: Secondary | ICD-10-CM | POA: Diagnosis not present

## 2019-01-07 DIAGNOSIS — I2510 Atherosclerotic heart disease of native coronary artery without angina pectoris: Secondary | ICD-10-CM | POA: Diagnosis not present

## 2019-02-19 DIAGNOSIS — E785 Hyperlipidemia, unspecified: Secondary | ICD-10-CM | POA: Diagnosis not present

## 2019-02-19 DIAGNOSIS — R5383 Other fatigue: Secondary | ICD-10-CM | POA: Diagnosis not present

## 2019-02-19 DIAGNOSIS — Z1331 Encounter for screening for depression: Secondary | ICD-10-CM | POA: Diagnosis not present

## 2019-02-19 DIAGNOSIS — M25562 Pain in left knee: Secondary | ICD-10-CM | POA: Diagnosis not present

## 2019-02-19 DIAGNOSIS — R7301 Impaired fasting glucose: Secondary | ICD-10-CM | POA: Diagnosis not present

## 2019-02-19 DIAGNOSIS — E559 Vitamin D deficiency, unspecified: Secondary | ICD-10-CM | POA: Diagnosis not present

## 2019-02-19 DIAGNOSIS — G20 Parkinson's disease: Secondary | ICD-10-CM | POA: Diagnosis not present

## 2019-02-19 DIAGNOSIS — Z0001 Encounter for general adult medical examination with abnormal findings: Secondary | ICD-10-CM | POA: Diagnosis not present

## 2019-02-19 DIAGNOSIS — I10 Essential (primary) hypertension: Secondary | ICD-10-CM | POA: Diagnosis not present

## 2019-03-16 DIAGNOSIS — R0902 Hypoxemia: Secondary | ICD-10-CM | POA: Diagnosis not present

## 2019-03-16 DIAGNOSIS — R55 Syncope and collapse: Secondary | ICD-10-CM | POA: Diagnosis not present

## 2019-03-18 DIAGNOSIS — I10 Essential (primary) hypertension: Secondary | ICD-10-CM | POA: Diagnosis not present

## 2019-03-18 DIAGNOSIS — E782 Mixed hyperlipidemia: Secondary | ICD-10-CM | POA: Diagnosis not present

## 2019-03-18 DIAGNOSIS — I351 Nonrheumatic aortic (valve) insufficiency: Secondary | ICD-10-CM | POA: Diagnosis not present

## 2019-03-18 DIAGNOSIS — R55 Syncope and collapse: Secondary | ICD-10-CM | POA: Diagnosis not present

## 2019-03-18 DIAGNOSIS — R0602 Shortness of breath: Secondary | ICD-10-CM | POA: Diagnosis not present

## 2019-03-18 DIAGNOSIS — I2510 Atherosclerotic heart disease of native coronary artery without angina pectoris: Secondary | ICD-10-CM | POA: Diagnosis not present

## 2019-03-18 DIAGNOSIS — I340 Nonrheumatic mitral (valve) insufficiency: Secondary | ICD-10-CM | POA: Diagnosis not present

## 2019-03-19 HISTORY — PX: EYE SURGERY: SHX253

## 2019-03-23 DIAGNOSIS — L218 Other seborrheic dermatitis: Secondary | ICD-10-CM | POA: Diagnosis not present

## 2019-04-01 DIAGNOSIS — R55 Syncope and collapse: Secondary | ICD-10-CM | POA: Diagnosis not present

## 2019-04-06 DIAGNOSIS — I10 Essential (primary) hypertension: Secondary | ICD-10-CM | POA: Diagnosis not present

## 2019-04-06 DIAGNOSIS — R0602 Shortness of breath: Secondary | ICD-10-CM | POA: Diagnosis not present

## 2019-04-06 DIAGNOSIS — R55 Syncope and collapse: Secondary | ICD-10-CM | POA: Diagnosis not present

## 2019-04-06 DIAGNOSIS — I351 Nonrheumatic aortic (valve) insufficiency: Secondary | ICD-10-CM | POA: Diagnosis not present

## 2019-04-06 DIAGNOSIS — I2510 Atherosclerotic heart disease of native coronary artery without angina pectoris: Secondary | ICD-10-CM | POA: Diagnosis not present

## 2019-04-06 DIAGNOSIS — E782 Mixed hyperlipidemia: Secondary | ICD-10-CM | POA: Diagnosis not present

## 2019-04-06 DIAGNOSIS — I340 Nonrheumatic mitral (valve) insufficiency: Secondary | ICD-10-CM | POA: Diagnosis not present

## 2019-05-10 DIAGNOSIS — E559 Vitamin D deficiency, unspecified: Secondary | ICD-10-CM | POA: Diagnosis not present

## 2019-05-10 DIAGNOSIS — E785 Hyperlipidemia, unspecified: Secondary | ICD-10-CM | POA: Diagnosis not present

## 2019-05-10 DIAGNOSIS — I10 Essential (primary) hypertension: Secondary | ICD-10-CM | POA: Diagnosis not present

## 2019-05-10 DIAGNOSIS — R5383 Other fatigue: Secondary | ICD-10-CM | POA: Diagnosis not present

## 2019-05-10 DIAGNOSIS — R7301 Impaired fasting glucose: Secondary | ICD-10-CM | POA: Diagnosis not present

## 2019-05-13 DIAGNOSIS — J309 Allergic rhinitis, unspecified: Secondary | ICD-10-CM | POA: Diagnosis not present

## 2019-05-13 DIAGNOSIS — E785 Hyperlipidemia, unspecified: Secondary | ICD-10-CM | POA: Diagnosis not present

## 2019-05-13 DIAGNOSIS — I10 Essential (primary) hypertension: Secondary | ICD-10-CM | POA: Diagnosis not present

## 2019-05-13 DIAGNOSIS — G20 Parkinson's disease: Secondary | ICD-10-CM | POA: Diagnosis not present

## 2019-06-29 DIAGNOSIS — H524 Presbyopia: Secondary | ICD-10-CM | POA: Diagnosis not present

## 2019-07-06 ENCOUNTER — Ambulatory Visit: Payer: Medicare HMO | Admitting: Neurology

## 2019-07-08 ENCOUNTER — Encounter: Payer: Self-pay | Admitting: Neurology

## 2019-07-08 ENCOUNTER — Ambulatory Visit: Payer: Medicare HMO | Admitting: Neurology

## 2019-07-08 VITALS — BP 132/72 | HR 72 | Temp 97.3°F | Ht 62.0 in | Wt 171.4 lb

## 2019-07-08 DIAGNOSIS — R269 Unspecified abnormalities of gait and mobility: Secondary | ICD-10-CM | POA: Insufficient documentation

## 2019-07-08 DIAGNOSIS — G20 Parkinson's disease: Secondary | ICD-10-CM

## 2019-07-08 MED ORDER — CARBIDOPA-LEVODOPA 25-100 MG PO TABS: 1.0000 | ORAL_TABLET | Freq: Three times a day (TID) | ORAL | 11 refills | Status: DC

## 2019-07-08 NOTE — Progress Notes (Signed)
PATIENT: Sheila Mitchell DOB: 14-Jun-1940   HISTORY OF PRESENT ILLNESS:  Sheila Mitchell a 79 years old female, seen in request byher primary care NPBoswell, Chelsafor evaluation of Parkinson's disease.Initial evaluation was on April 06, 2018  She has past medical history of hypertension, hyperlipidemia.  Patient reported a history of right hand tremor since summer 2019, smaller print with prolonged writing, she also noticed mild gait difficulty, difficulty initiate gait when first get up, unsteady gait, dragging her right leg across the floor, initially attributed to her hip, low back problem, had physical therapy which was helpful, also reported MRI of lumbar and thoracic spine by [facility] orthopedic surgeon reviewed degenerative disc disease, is planning on to have epidural injection.  She had decreased sense of smell for more than 10 years, denied constipation, occasionally orthostatic dizziness, tends to restless when sleep,   MRI of the brain in February 2020, showed no significant abnormalities, symptoms of PD include: tremor of right hand, gait difficulty, decreased sense of smell, restless sleeping   She joined a Parkinson's disease exercise group, she goes 3 times a week to do boxing, exercise.  She has been lifting weights.   UPDATE July 08 2019: She complains of increased gait abnormality, freeze difficulty moving her left leg, increased left hand tremor,  REVIEW OF SYSTEMS: Out of a complete 14 system review of symptoms, the patient complains only of the following symptoms, and all other reviewed systems are negative.  Tremor  ALLERGIES: No Known Allergies  HOME MEDICATIONS: Outpatient Medications Prior to Visit  Medication Sig Dispense Refill  . aspirin EC 81 MG tablet Take 81 mg by mouth daily.    . Calcium Carb-Cholecalciferol (CALCIUM + D3 PO) Take 1 tablet by mouth daily. Calcium w/ D3 400 units    . cholecalciferol (VITAMIN D3) 25 MCG (1000  UNIT) tablet Take 1,000 Units by mouth daily.    . Coenzyme Q10 (CO Q 10 PO) Take 200 mg by mouth daily.    Marland Kitchen lisinopril (PRINIVIL,ZESTRIL) 5 MG tablet Take 5 mg by mouth daily.    . Multiple Vitamin (MULTIVITAMIN) tablet Take 1 tablet by mouth daily.    . simvastatin (ZOCOR) 20 MG tablet Take 20 mg by mouth daily.    Marland Kitchen torsemide (DEMADEX) 20 MG tablet Take 20 mg by mouth daily.    . furosemide (LASIX) 20 MG tablet Take 20 mg by mouth daily.    . Loratadine (CLARITIN PO) Take 1 tablet by mouth daily as needed. OTC     No facility-administered medications prior to visit.    PAST MEDICAL HISTORY: Past Medical History:  Diagnosis Date  . Basal cell carcinoma   . Heart disease   . Hypertension   . Ruptured disc, thoracic   . Tremor     PAST SURGICAL HISTORY: Past Surgical History:  Procedure Laterality Date  . ABDOMINAL HYSTERECTOMY    . BASAL CELL CARCINOMA EXCISION     nose  . CHOLECYSTECTOMY    . TONSILLECTOMY      FAMILY HISTORY: Family History  Problem Relation Age of Onset  . COPD Mother   . Heart disease Father   . Diabetes Paternal Grandmother   . Breast cancer Neg Hx     SOCIAL HISTORY: Social History   Socioeconomic History  . Marital status: Widowed    Spouse name: Not on file  . Number of children: 3  . Years of education: one year college  . Highest education level: Not on  file  Occupational History  . Occupation: Retired  Tobacco Use  . Smoking status: Never Smoker  . Smokeless tobacco: Never Used  Substance and Sexual Activity  . Alcohol use: Yes    Comment: occasional use  . Drug use: Never  . Sexual activity: Not on file  Other Topics Concern  . Not on file  Social History Narrative   Lives at home alone.   Right-handed.   2 cups caffeine daily.   Social Determinants of Health   Financial Resource Strain:   . Difficulty of Paying Living Expenses:   Food Insecurity:   . Worried About Charity fundraiser in the Last Year:   . Youth worker in the Last Year:   Transportation Needs:   . Film/video editor (Medical):   Marland Kitchen Lack of Transportation (Non-Medical):   Physical Activity:   . Days of Exercise per Week:   . Minutes of Exercise per Session:   Stress:   . Feeling of Stress :   Social Connections:   . Frequency of Communication with Friends and Family:   . Frequency of Social Gatherings with Friends and Family:   . Attends Religious Services:   . Active Member of Clubs or Organizations:   . Attends Archivist Meetings:   Marland Kitchen Marital Status:   Intimate Partner Violence:   . Fear of Current or Ex-Partner:   . Emotionally Abused:   Marland Kitchen Physically Abused:   . Sexually Abused:    PHYSICAL EXAM  Vitals:   07/08/19 1439  BP: 132/72  Pulse: 72  Temp: (!) 97.3 F (36.3 C)  Weight: 171 lb 6.4 oz (77.7 kg)  Height: 5\' 2"  (1.575 m)   Body mass index is 31.35 kg/m.  PHYSICAL EXAMNIATION:  Gen: NAD, conversant, well nourised, well groomed                     Cardiovascular: Regular rate rhythm, no peripheral edema, warm, nontender. Eyes: Conjunctivae clear without exudates or hemorrhage Neck: Supple, no carotid bruits. Pulmonary: Clear to auscultation bilaterally   NEUROLOGICAL EXAM:  MENTAL STATUS: Speech:    Speech is normal; fluent and spontaneous with normal comprehension.  Cognition:     Orientation to time, place and person     Normal recent and remote memory     Normal Attention span and concentration     Normal Language, naming, repeating,spontaneous speech     Fund of knowledge   CRANIAL NERVES: CN II: Visual fields are full to confrontation.  Pupils are round equal and briskly reactive to light. CN III, IV, VI: extraocular movement are normal. No ptosis. CN V: Facial sensation is intact to pinprick in all 3 divisions bilaterally. Corneal responses are intact.  CN VII: Face is symmetric with normal eye closure and smile. CN VIII: Hearing is normal to casual conversation CN IX,  X: Palate elevates symmetrically. Phonation is normal. CN XI: Head turning and shoulder shrug are intact CN XII: Tongue is midline with normal movements and no atrophy.  MOTOR: Some left hand resting tremor, moderate left more than right rigidity, bradykinesia, no weakness,  REFLEXES: Reflexes are 2+ and symmetric at the biceps, triceps, knees, and ankles. Plantar responses are flexor.  SENSORY: Intact to light touch, pinprick, positional and vibratory sensation are intact in fingers and toes.  COORDINATION: Rapid alternating movements and fine finger movements are intact. There is no dysmetria on finger-to-nose and heel-knee-shin.    GAIT/STANCE: She needs  push-up to get up from seated position, decreased left arm swing, small stride, leaning forward, en bloc turning   DIAGNOSTIC DATA (LABS, IMAGING, TESTING) - I reviewed patient records, labs, notes, testing and imaging myself where available.  ASSESSMENT AND PLAN 79 y.o. year old female   Idiopathic Parkinson's disease Worsening symptoms Worsening gait abnormality, will start Sinemet 25/100 mg twice a day, Refer her to physical therapy,  Marcial Pacas, M.D. Ph.D.  [facility] [place], [place] [address] Phone: [phone] Fax:      [phone]

## 2019-07-26 DIAGNOSIS — H25813 Combined forms of age-related cataract, bilateral: Secondary | ICD-10-CM | POA: Diagnosis not present

## 2019-08-03 DIAGNOSIS — R2681 Unsteadiness on feet: Secondary | ICD-10-CM | POA: Diagnosis not present

## 2019-08-03 DIAGNOSIS — M6281 Muscle weakness (generalized): Secondary | ICD-10-CM | POA: Diagnosis not present

## 2019-08-09 DIAGNOSIS — R2681 Unsteadiness on feet: Secondary | ICD-10-CM | POA: Diagnosis not present

## 2019-08-09 DIAGNOSIS — M6281 Muscle weakness (generalized): Secondary | ICD-10-CM | POA: Diagnosis not present

## 2019-08-10 DIAGNOSIS — E782 Mixed hyperlipidemia: Secondary | ICD-10-CM | POA: Diagnosis not present

## 2019-08-10 DIAGNOSIS — I340 Nonrheumatic mitral (valve) insufficiency: Secondary | ICD-10-CM | POA: Diagnosis not present

## 2019-08-10 DIAGNOSIS — I349 Nonrheumatic mitral valve disorder, unspecified: Secondary | ICD-10-CM | POA: Diagnosis not present

## 2019-08-10 DIAGNOSIS — I351 Nonrheumatic aortic (valve) insufficiency: Secondary | ICD-10-CM | POA: Diagnosis not present

## 2019-08-10 DIAGNOSIS — I10 Essential (primary) hypertension: Secondary | ICD-10-CM | POA: Diagnosis not present

## 2019-08-10 DIAGNOSIS — R609 Edema, unspecified: Secondary | ICD-10-CM | POA: Diagnosis not present

## 2019-08-17 DIAGNOSIS — R2681 Unsteadiness on feet: Secondary | ICD-10-CM | POA: Diagnosis not present

## 2019-08-17 DIAGNOSIS — M6281 Muscle weakness (generalized): Secondary | ICD-10-CM | POA: Diagnosis not present

## 2019-08-18 DIAGNOSIS — R2681 Unsteadiness on feet: Secondary | ICD-10-CM | POA: Diagnosis not present

## 2019-08-18 DIAGNOSIS — M6281 Muscle weakness (generalized): Secondary | ICD-10-CM | POA: Diagnosis not present

## 2019-08-19 DIAGNOSIS — M6281 Muscle weakness (generalized): Secondary | ICD-10-CM | POA: Diagnosis not present

## 2019-08-19 DIAGNOSIS — R2681 Unsteadiness on feet: Secondary | ICD-10-CM | POA: Diagnosis not present

## 2019-08-24 ENCOUNTER — Telehealth: Payer: Self-pay | Admitting: Neurology

## 2019-08-24 DIAGNOSIS — M6281 Muscle weakness (generalized): Secondary | ICD-10-CM | POA: Diagnosis not present

## 2019-08-24 DIAGNOSIS — R2681 Unsteadiness on feet: Secondary | ICD-10-CM | POA: Diagnosis not present

## 2019-08-24 NOTE — Telephone Encounter (Signed)
Higher protein content food may delay the absorption of Sinemet, she would get best benefit if she wait 2 hours after Sinemet dosing before she eat high protein content food  I do not have enough knowledge to suggest her protein content in her daily diet, she may counsel her primary care physician, even consider nutritional refer

## 2019-08-24 NOTE — Telephone Encounter (Signed)
I have returned the call to the patient. She verbalized understanding of the information below. She does not feel she needs a nutritional referral. She eats a well balanced diet and does not overeat protein. Most days, she only eats two meals.

## 2019-08-24 NOTE — Telephone Encounter (Signed)
Pt called wanting to speak to RN about her carbidopa-levodopa (SINEMET IR) 25-100 MG tablet Pt wants to know how much protein she is to have being on this medication. Please advise.

## 2019-08-26 DIAGNOSIS — R2681 Unsteadiness on feet: Secondary | ICD-10-CM | POA: Diagnosis not present

## 2019-08-26 DIAGNOSIS — M6281 Muscle weakness (generalized): Secondary | ICD-10-CM | POA: Diagnosis not present

## 2019-08-31 DIAGNOSIS — R2681 Unsteadiness on feet: Secondary | ICD-10-CM | POA: Diagnosis not present

## 2019-08-31 DIAGNOSIS — M6281 Muscle weakness (generalized): Secondary | ICD-10-CM | POA: Diagnosis not present

## 2019-09-01 DIAGNOSIS — H2511 Age-related nuclear cataract, right eye: Secondary | ICD-10-CM | POA: Diagnosis not present

## 2019-09-01 DIAGNOSIS — H52221 Regular astigmatism, right eye: Secondary | ICD-10-CM | POA: Diagnosis not present

## 2019-09-01 DIAGNOSIS — I10 Essential (primary) hypertension: Secondary | ICD-10-CM | POA: Diagnosis not present

## 2019-09-08 DIAGNOSIS — H2511 Age-related nuclear cataract, right eye: Secondary | ICD-10-CM | POA: Diagnosis not present

## 2019-09-09 DIAGNOSIS — M6281 Muscle weakness (generalized): Secondary | ICD-10-CM | POA: Diagnosis not present

## 2019-09-09 DIAGNOSIS — R2681 Unsteadiness on feet: Secondary | ICD-10-CM | POA: Diagnosis not present

## 2019-09-15 DIAGNOSIS — H2512 Age-related nuclear cataract, left eye: Secondary | ICD-10-CM | POA: Diagnosis not present

## 2019-09-15 DIAGNOSIS — H52222 Regular astigmatism, left eye: Secondary | ICD-10-CM | POA: Diagnosis not present

## 2019-09-15 DIAGNOSIS — I10 Essential (primary) hypertension: Secondary | ICD-10-CM | POA: Diagnosis not present

## 2019-09-28 DIAGNOSIS — M6281 Muscle weakness (generalized): Secondary | ICD-10-CM | POA: Diagnosis not present

## 2019-09-28 DIAGNOSIS — R2681 Unsteadiness on feet: Secondary | ICD-10-CM | POA: Diagnosis not present

## 2019-09-30 DIAGNOSIS — M6281 Muscle weakness (generalized): Secondary | ICD-10-CM | POA: Diagnosis not present

## 2019-09-30 DIAGNOSIS — R2681 Unsteadiness on feet: Secondary | ICD-10-CM | POA: Diagnosis not present

## 2019-10-01 DIAGNOSIS — R69 Illness, unspecified: Secondary | ICD-10-CM | POA: Diagnosis not present

## 2019-10-07 ENCOUNTER — Other Ambulatory Visit: Payer: Self-pay | Admitting: Nurse Practitioner

## 2019-10-07 DIAGNOSIS — Z1231 Encounter for screening mammogram for malignant neoplasm of breast: Secondary | ICD-10-CM

## 2019-10-28 DIAGNOSIS — Z0100 Encounter for examination of eyes and vision without abnormal findings: Secondary | ICD-10-CM | POA: Diagnosis not present

## 2019-11-15 ENCOUNTER — Ambulatory Visit
Admission: RE | Admit: 2019-11-15 | Discharge: 2019-11-15 | Disposition: A | Discharge status: Home / Self Care | Payer: Medicare HMO | Source: Ambulatory Visit | Attending: Nurse Practitioner | Admitting: Nurse Practitioner

## 2019-11-15 ENCOUNTER — Other Ambulatory Visit: Payer: Self-pay

## 2019-11-15 DIAGNOSIS — Z1231 Encounter for screening mammogram for malignant neoplasm of breast: Secondary | ICD-10-CM | POA: Insufficient documentation

## 2019-11-18 ENCOUNTER — Other Ambulatory Visit: Payer: Self-pay | Admitting: Nurse Practitioner

## 2019-11-18 DIAGNOSIS — R928 Other abnormal and inconclusive findings on diagnostic imaging of breast: Secondary | ICD-10-CM

## 2019-11-18 DIAGNOSIS — N6320 Unspecified lump in the left breast, unspecified quadrant: Secondary | ICD-10-CM

## 2019-11-29 DIAGNOSIS — E559 Vitamin D deficiency, unspecified: Secondary | ICD-10-CM | POA: Diagnosis not present

## 2019-11-29 DIAGNOSIS — E782 Mixed hyperlipidemia: Secondary | ICD-10-CM | POA: Diagnosis not present

## 2019-11-29 DIAGNOSIS — I10 Essential (primary) hypertension: Secondary | ICD-10-CM | POA: Diagnosis not present

## 2019-11-29 DIAGNOSIS — R5383 Other fatigue: Secondary | ICD-10-CM | POA: Diagnosis not present

## 2019-11-29 DIAGNOSIS — G603 Idiopathic progressive neuropathy: Secondary | ICD-10-CM | POA: Diagnosis not present

## 2019-11-30 ENCOUNTER — Ambulatory Visit
Admission: RE | Admit: 2019-11-30 | Discharge: 2019-11-30 | Disposition: A | Discharge status: Home / Self Care | Payer: Medicare HMO | Source: Ambulatory Visit | Attending: Nurse Practitioner | Admitting: Nurse Practitioner

## 2019-11-30 ENCOUNTER — Other Ambulatory Visit: Payer: Self-pay

## 2019-11-30 DIAGNOSIS — R928 Other abnormal and inconclusive findings on diagnostic imaging of breast: Secondary | ICD-10-CM | POA: Diagnosis not present

## 2019-11-30 DIAGNOSIS — N6489 Other specified disorders of breast: Secondary | ICD-10-CM | POA: Diagnosis not present

## 2019-11-30 DIAGNOSIS — N6320 Unspecified lump in the left breast, unspecified quadrant: Secondary | ICD-10-CM | POA: Insufficient documentation

## 2019-11-30 HISTORY — PX: BREAST BIOPSY: SHX20

## 2019-12-02 ENCOUNTER — Other Ambulatory Visit: Payer: Self-pay | Admitting: Nurse Practitioner

## 2019-12-02 DIAGNOSIS — G20 Parkinson's disease: Secondary | ICD-10-CM | POA: Diagnosis not present

## 2019-12-02 DIAGNOSIS — Z23 Encounter for immunization: Secondary | ICD-10-CM | POA: Diagnosis not present

## 2019-12-02 DIAGNOSIS — I10 Essential (primary) hypertension: Secondary | ICD-10-CM | POA: Diagnosis not present

## 2019-12-02 DIAGNOSIS — R928 Other abnormal and inconclusive findings on diagnostic imaging of breast: Secondary | ICD-10-CM

## 2019-12-02 DIAGNOSIS — E785 Hyperlipidemia, unspecified: Secondary | ICD-10-CM | POA: Diagnosis not present

## 2019-12-02 DIAGNOSIS — N6320 Unspecified lump in the left breast, unspecified quadrant: Secondary | ICD-10-CM

## 2019-12-03 ENCOUNTER — Other Ambulatory Visit: Payer: Self-pay

## 2019-12-03 ENCOUNTER — Ambulatory Visit
Admission: RE | Admit: 2019-12-03 | Discharge: 2019-12-03 | Disposition: A | Discharge status: Home / Self Care | Payer: Medicare HMO | Source: Ambulatory Visit | Attending: Nurse Practitioner | Admitting: Nurse Practitioner

## 2019-12-03 DIAGNOSIS — R928 Other abnormal and inconclusive findings on diagnostic imaging of breast: Secondary | ICD-10-CM | POA: Insufficient documentation

## 2019-12-03 DIAGNOSIS — N6320 Unspecified lump in the left breast, unspecified quadrant: Secondary | ICD-10-CM | POA: Diagnosis present

## 2019-12-03 DIAGNOSIS — N6323 Unspecified lump in the left breast, lower outer quadrant: Secondary | ICD-10-CM | POA: Diagnosis not present

## 2019-12-03 DIAGNOSIS — D0512 Intraductal carcinoma in situ of left breast: Secondary | ICD-10-CM | POA: Diagnosis not present

## 2019-12-06 LAB — SURGICAL PATHOLOGY

## 2019-12-07 DIAGNOSIS — D0512 Intraductal carcinoma in situ of left breast: Secondary | ICD-10-CM | POA: Diagnosis not present

## 2019-12-07 NOTE — Progress Notes (Signed)
Navigation initiated.  Scheduled with Dr. Bary Castilla today,and D. Janese Banks on 12/13/19.  Per Dr. Bary Castilla , referral to research to discuss COMET trial.  Yolande Jolly to meet patient Monday.

## 2019-12-09 DIAGNOSIS — D2262 Melanocytic nevi of left upper limb, including shoulder: Secondary | ICD-10-CM | POA: Diagnosis not present

## 2019-12-09 DIAGNOSIS — Z85828 Personal history of other malignant neoplasm of skin: Secondary | ICD-10-CM | POA: Diagnosis not present

## 2019-12-09 DIAGNOSIS — D225 Melanocytic nevi of trunk: Secondary | ICD-10-CM | POA: Diagnosis not present

## 2019-12-09 DIAGNOSIS — D2261 Melanocytic nevi of right upper limb, including shoulder: Secondary | ICD-10-CM | POA: Diagnosis not present

## 2019-12-09 DIAGNOSIS — D2271 Melanocytic nevi of right lower limb, including hip: Secondary | ICD-10-CM | POA: Diagnosis not present

## 2019-12-09 DIAGNOSIS — L720 Epidermal cyst: Secondary | ICD-10-CM | POA: Diagnosis not present

## 2019-12-13 ENCOUNTER — Inpatient Hospital Stay: Payer: Medicare HMO | Attending: Oncology | Admitting: Oncology

## 2019-12-13 ENCOUNTER — Inpatient Hospital Stay: Payer: Medicare HMO

## 2019-12-13 ENCOUNTER — Encounter: Payer: Self-pay | Admitting: Oncology

## 2019-12-13 ENCOUNTER — Other Ambulatory Visit: Payer: Self-pay

## 2019-12-13 VITALS — BP 120/84 | HR 64 | Temp 98.4°F | Resp 16 | Ht 62.0 in | Wt 169.2 lb

## 2019-12-13 DIAGNOSIS — Z79899 Other long term (current) drug therapy: Secondary | ICD-10-CM | POA: Diagnosis not present

## 2019-12-13 DIAGNOSIS — D0512 Intraductal carcinoma in situ of left breast: Secondary | ICD-10-CM | POA: Diagnosis not present

## 2019-12-13 DIAGNOSIS — Z7189 Other specified counseling: Secondary | ICD-10-CM

## 2019-12-13 DIAGNOSIS — Z7982 Long term (current) use of aspirin: Secondary | ICD-10-CM | POA: Insufficient documentation

## 2019-12-13 DIAGNOSIS — Z85828 Personal history of other malignant neoplasm of skin: Secondary | ICD-10-CM | POA: Diagnosis not present

## 2019-12-13 DIAGNOSIS — I10 Essential (primary) hypertension: Secondary | ICD-10-CM | POA: Diagnosis not present

## 2019-12-13 DIAGNOSIS — R269 Unspecified abnormalities of gait and mobility: Secondary | ICD-10-CM | POA: Diagnosis not present

## 2019-12-13 DIAGNOSIS — G20 Parkinson's disease: Secondary | ICD-10-CM | POA: Diagnosis not present

## 2019-12-13 DIAGNOSIS — Z170 Estrogen receptor positive status [ER+]: Secondary | ICD-10-CM | POA: Diagnosis not present

## 2019-12-13 NOTE — Progress Notes (Signed)
Patient ID: FYNN ADEL, female   DOB: [birth year], 79 y.o.   MRN: [id number] Accompanied to initial Med/Onc visit with Dr. Janese Banks. Notified Dr. Dwyane Luo office , patient ready to schedule surgery.

## 2019-12-13 NOTE — Progress Notes (Signed)
Pt is here as new pt with DCIS, navigator will be with pt today. She still has bruising from bx but no pain.

## 2019-12-16 ENCOUNTER — Encounter: Payer: Self-pay | Admitting: Oncology

## 2019-12-16 ENCOUNTER — Other Ambulatory Visit: Payer: Self-pay | Admitting: General Surgery

## 2019-12-16 DIAGNOSIS — Z7189 Other specified counseling: Secondary | ICD-10-CM | POA: Insufficient documentation

## 2019-12-16 DIAGNOSIS — I351 Nonrheumatic aortic (valve) insufficiency: Secondary | ICD-10-CM | POA: Diagnosis not present

## 2019-12-16 DIAGNOSIS — E782 Mixed hyperlipidemia: Secondary | ICD-10-CM | POA: Diagnosis not present

## 2019-12-16 DIAGNOSIS — I340 Nonrheumatic mitral (valve) insufficiency: Secondary | ICD-10-CM | POA: Diagnosis not present

## 2019-12-16 DIAGNOSIS — I10 Essential (primary) hypertension: Secondary | ICD-10-CM | POA: Diagnosis not present

## 2019-12-16 DIAGNOSIS — R0602 Shortness of breath: Secondary | ICD-10-CM | POA: Diagnosis not present

## 2019-12-16 DIAGNOSIS — I2510 Atherosclerotic heart disease of native coronary artery without angina pectoris: Secondary | ICD-10-CM | POA: Diagnosis not present

## 2019-12-16 NOTE — Progress Notes (Signed)
Subjective:     Patient ID: Sheila Mitchell is a 79 y.o. female.  HPI  The following portions of the patient's history were reviewed and updated as appropriate.  This a new patient is here today for: office visit. Patient is here today for a breast cancer discussion. She had a left breast ultrasound guided core biopsy done that showed DCIS, 12-03-19. This was found on routine mammogram, she does do self breast checks and could not feel anything different. Denies family history of breast cancer.       Chief Complaint  Patient presents with   New Patient    left breast cancer, biospy was 12-03-19     BP 128/78    Pulse 82    Temp 36.6 C (97.8 F)    Ht 157.5 cm (5\' 2" )    Wt 75.3 kg (166 lb)    SpO2 98%    BMI 30.36 kg/m       Past Medical History:  Diagnosis Date   Aortic regurgitation    CAD (coronary artery disease)    Hyperlipemia    Hypertension    Mitral regurgitation    Parkinson disease (CMS-HCC)           Past Surgical History:  Procedure Laterality Date   APPENDECTOMY     age 10   bsc removal     CHOLECYSTECTOMY     COLONOSCOPY  2012   EXTRACTION CATARACT EXTRACAPSULAR W/INSERTION INTRAOCULAR PROSTHESIS Right 09/01/2019   Procedure: RIGHT-Lensx ORA EXTRACTION CATARACT EXTRACAPSULAR WITH PHACO WITH INSERTION INTRAOCULAR PROSTHESIS;  Surgeon: Netta Corrigan, MD;  Location: [facility];  Service: Ophthalmology;  Laterality: Right;   EXTRACTION CATARACT EXTRACAPSULAR W/INSERTION INTRAOCULAR PROSTHESIS Left 09/15/2019   Procedure: LEFT- LENSX, ORA EXTRACTION CATARACT EXTRACAPSULAR WITH PHACO WITH INSERTION INTRAOCULAR PROSTHESIS;  Surgeon: Netta Corrigan, MD;  Location: [facility];  Service: Ophthalmology;  Laterality: Left;   HYSTERECTOMY     age 40 /complete   TONSILLECTOMY AND ADENOIDECTOMY                OB History    Gravida  0   Para  0   Term  0   Preterm  0   AB  0   Living  0      SAB  0   TAB  0   Ectopic  0   Molar  0   Multiple  0   Live Births  0       Obstetric Comments  Age at first period 81 Hysterectomy age 51         Social History          Socioeconomic History   Marital status: Widowed    Spouse name: Not on file   Number of children: Not on file   Years of education: Not on file   Highest education level: Not on file  Occupational History   Not on file  Tobacco Use   Smoking status: Never Smoker   Smokeless tobacco: Never Used  Vaping Use   Vaping Use: Never used  Substance and Sexual Activity   Alcohol use: Not Currently   Drug use: No   Sexual activity: Defer  Other Topics Concern   Not on file  Social History Narrative   Not on file   Social Determinants of Health      Financial Resource Strain:    Difficulty of Paying Living Expenses:   Food Insecurity:    Worried About Running  Out of Food in the Last Year:    Ran Out of Food in the Last Year:   Transportation Needs:    Lack of Transportation (Medical):    Lack of Transportation (Non-Medical):        No Known Allergies  Current Medications        Current Outpatient Medications  Medication Sig Dispense Refill   aspirin 81 MG EC tablet Take 81 mg by mouth once daily     cal cit-mag-D3-Zn-cop-man-bor 250-40-125 mg-mg-unit Tab Citracal Plus Oral Tablet QTY: 0 tablet Days: 0 Refills: 0  Written: 07/10/15 Patient Instructions:      carbidopa-levodopa (SINEMET) 25-100 mg tablet Take 1 tablet by mouth 3 (three) times daily     coenzyme Q10 10 mg capsule Co Q 10 Oral Capsule QTY: 0 capsule Days: 0 Refills: 0  Written: 07/10/15 Patient Instructions:      lisinopril (PRINIVIL,ZESTRIL) 5 MG tablet Take 5 mg by mouth once daily        multivitamin-lutein (CENTRUM SILVER) tablet Centrum Silver Oral Tablet QTY: 0 tablet Days: 0 Refills: 0  Written: 07/10/15 Patient Instructions:      omega-3 fatty acids/fish oil  (FISH OIL) 340-1,000 mg capsule Fish Oil 1000MG  Oral Capsule QTY: 0 capsule Days: 0 Refills: 0  Written: 07/10/15 Patient Instructions:      simvastatin (ZOCOR) 20 MG tablet Simvastatin 20MG  Oral Tablet QTY: 90 tablet Days: 90 Refills: 3  Written: 05/07/16 Patient Instructions: once a day at night     TORsemide (DEMADEX) 20 MG tablet Take 20 mg by mouth once daily     No current facility-administered medications for this visit.           Family History  Problem Relation Age of Onset   Emphysema Mother    Heart failure Father    Thyroid disease Sister    Heart failure Maternal Grandmother    Heart failure Paternal Grandmother    Diabetes Paternal Grandmother    Anesthesia problems Neg Hx    Malignant hyperthermia Neg Hx    Breast cancer Neg Hx    Lung cancer Neg Hx    Colon cancer Neg Hx       Review of Systems  Constitutional: Negative for chills and fever.  Respiratory: Negative for cough.        Objective:   Physical Exam Exam conducted with a chaperone present.  Constitutional:      Appearance: Normal appearance.  Cardiovascular:     Rate and Rhythm: Normal rate and regular rhythm.     Pulses: Normal pulses.     Heart sounds: Normal heart sounds.  Pulmonary:     Effort: Pulmonary effort is normal.     Breath sounds: Normal breath sounds.  Musculoskeletal:     Cervical back: Neck supple.  Skin:    General: Skin is warm and dry.  Neurological:     Mental Status: She is alert and oriented to person, place, and time.  Psychiatric:        Mood and Affect: Mood normal.        Behavior: Behavior normal.     Labs and Radiology:   November 12, 2018 through December 03, 2019 mammograms and ultrasounds have been independently reviewed.  The diagnostic study of November 30, 2019 is summarized as follows: IMPRESSION: 1. Indeterminate left breast mass at the 3 o'clock position corresponding with the screening mammographic  findings. Recommendation is for ultrasound-guided biopsy. 2. No suspicious left axillary lymphadenopathy.  Biopsy results  from December 03, 2019: DIAGNOSIS:  A. 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   Comment:  The small amount of tissue adjacent to the fibroadenoma does not contain  any ducts, so it cannot be determined from this sample whether the DCIS  is limited to the fibroadenoma or is present in the surrounding breast  tissue.   Ultrasound examination:  Ultrasound examination of the left breast in the 3 o'clock position was undertaken to determine if preoperative wire localization would be required.  There is a well-defined area corresponding to the [place] measuring 0.7 x 1.2 x 1.8 cm.  The biopsy clip is well identified within this lesion.  This corresponds to the postbiopsy imaging studies of December 03, 2019.  [id number].    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    Plan:     The options for management were reviewed in detail with the patient.  Wide excision (with postoperative radiation) and mastectomy were presented is equivalent procedures.  The patient is in excellent health, and likely has a 10+ year life expectancy.  I would be reluctant to omit postoperative radiation if she chooses breast conservation.  We spent some time discussing the possibility of participating in the "comet trial".  After the visit it turns out that the patient is not a candidate because of the "mass" which is indeed the fibroadenoma within the breast where the microcalcifications and subsequent DCIS were identified.  She has been notified that she is not a candidate for trial participation by phone.  The patient is not wildly excited about any intervention.  She questioned what would happen if she "did nothing"  my hope would be that at a minimum she would consider antiestrogen therapy (ER not yet completed).  She is very healthy, with a long life expectancy and I think that surgical intervention would be in her best interest.  1 could make an argument regarding the pros and cons of post surgery radiation, but at a minimum surgery would be advisable.  Based on the office ultrasound completed today preoperative wire localization would not be required.  As this is a fairly superficial lesion, it is likely that the small wedge of skin will be resected at the time of surgery to assure negative margins.  We spent over an hour reviewing options for management.  The patient is scheduled to meet with medical oncology next week.  She has been given an Materials engineer as well as pertinent websites to review.  It was emphasized that she does have DCIS and that she needs to be sure she is reviewing the proper subject areas.  Based on the findings I would not plan on a sentinel node biopsy.      Entered by Ledell Noss, CMA, acting as a scribe for Dr. Hervey Ard, MD.   The documentation recorded by the scribe accurately reflects the service I personally performed and the decisions made by me.   Robert Bellow, MD FACS

## 2019-12-16 NOTE — Progress Notes (Signed)
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 Charlynn Grimes  Date of visit: 12/16/19   History of presenting illness- Patient is a 78 year old female referred for new diagnosis of DCIS.  She has not had any prior abnormal breast biopsy.  She she is G0 P0 L0.  Most recent mammogram in September 2021 showed an indeterminate left breast mass in the 3 o'clock position.  No left axillary adenopathy.  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  Patient has seen Dr. Bary Castilla and will be undergoing lumpectomy.  Otherwise patient is doing well and denies any complaints at this time.  Appetite and weight has remained stable.  Denies any new aches and pains anywhere.  ECOG PS- 1  Pain scale- 0   Review of systems- Review of Systems  Constitutional: Negative for chills, fever, malaise/fatigue and weight loss.  HENT: Negative for congestion, ear discharge and nosebleeds.   Eyes: Negative for blurred vision.  Respiratory: Negative for cough, hemoptysis, sputum production, shortness of breath and wheezing.   Cardiovascular: Negative for chest pain, palpitations, orthopnea and claudication.  Gastrointestinal: Negative for abdominal pain, blood in stool, constipation, diarrhea, heartburn, melena, nausea and vomiting.  Genitourinary: Negative for dysuria, flank pain, frequency, hematuria and urgency.  Musculoskeletal: Negative for back pain, joint pain and myalgias.  Skin: Negative for rash.  Neurological: Negative for dizziness, tingling, focal weakness, seizures, weakness and headaches.  Endo/Heme/Allergies: Does not bruise/bleed  easily.  Psychiatric/Behavioral: Negative for depression and suicidal ideas. The patient does not have insomnia.     No Known Allergies  Patient Active Problem List   Diagnosis Date Noted  . Gait abnormality 07/08/2019  . Parkinson's disease ([place]) 04/06/2018     Past Medical History:  Diagnosis Date  . Basal cell carcinoma    basal cell on left nose  . Breast cancer ([place])   . Heart disease   . Hypertension   . Leaky heart valve   . Parkinson disease ([place])   . Ruptured disc, thoracic   . Tremor      Past Surgical History:  Procedure Laterality Date  . ABDOMINAL HYSTERECTOMY     complete  . APPENDECTOMY     at the same time of hysterectomy  . BASAL CELL CARCINOMA EXCISION     nose  . CHOLECYSTECTOMY    . TONSILLECTOMY      Social History   Socioeconomic History  . Marital status: Widowed    Spouse name: Not on file  . Number of children: 3  . Years of education: one year college  . Highest education level: Not on file  Occupational History  . Occupation: Retired  Tobacco Use  . Smoking status: Never Smoker  . Smokeless tobacco: Never Used  Vaping Use  . Vaping Use: Never used  Substance and Sexual Activity  . Alcohol use: Yes    Comment: occasion swallow of one drink  . Drug use: Never  . Sexual activity: Not Currently  Other Topics Concern  . Not on file  Social History Narrative   Lives at home alone.   Right-handed.   2 cups caffeine daily.   Social Determinants of Health   Financial  Resource Strain:   . Difficulty of Paying Living Expenses: Not on file  Food Insecurity:   . Worried About Charity fundraiser in the Last Year: Not on file  . Ran Out of Food in the Last Year: Not on file  Transportation Needs:   . Lack of Transportation (Medical): Not on file  . Lack of Transportation (Non-Medical): Not on file  Physical Activity:   . Days of Exercise per Week: Not on file  . Minutes of Exercise per Session: Not on file  Stress:   .  Feeling of Stress : Not on file  Social Connections:   . Frequency of Communication with Friends and Family: Not on file  . Frequency of Social Gatherings with Friends and Family: Not on file  . Attends Religious Services: Not on file  . Active Member of Clubs or Organizations: Not on file  . Attends Archivist Meetings: Not on file  . Marital Status: Not on file  Intimate Partner Violence:   . Fear of Current or Ex-Partner: Not on file  . Emotionally Abused: Not on file  . Physically Abused: Not on file  . Sexually Abused: Not on file     Family History  Problem Relation Age of Onset  . COPD Mother   . Heart disease Father   . Diabetes Paternal Grandmother   . Breast cancer Neg Hx      Current Outpatient Medications:  .  aspirin EC 81 MG tablet, Take 81 mg by mouth daily., Disp: , Rfl:  .  Calcium Carb-Cholecalciferol (CALCIUM + D3 PO), Take 1 tablet by mouth daily. Calcium w/ D3 400 units, Disp: , Rfl:  .  carbidopa-levodopa (SINEMET IR) 25-100 MG tablet, Take 1 tablet by mouth 3 (three) times daily., Disp: 90 tablet, Rfl: 11 .  cholecalciferol (VITAMIN D3) 25 MCG (1000 UNIT) tablet, Take 1,000 Units by mouth daily., Disp: , Rfl:  .  Coenzyme Q10 (CO Q 10 PO), Take 200 mg by mouth daily., Disp: , Rfl:  .  lisinopril (PRINIVIL,ZESTRIL) 5 MG tablet, Take 5 mg by mouth daily., Disp: , Rfl:  .  Multiple Vitamin (MULTIVITAMIN) tablet, Take 1 tablet by mouth daily., Disp: , Rfl:  .  simvastatin (ZOCOR) 20 MG tablet, Take 20 mg by mouth daily., Disp: , Rfl:  .  torsemide (DEMADEX) 20 MG tablet, Take 20 mg by mouth daily., Disp: , Rfl:    Physical exam:  Vitals:   12/13/19 1339  BP: 120/84  Pulse: 64  Resp: 16  Temp: 98.4 F (36.9 C)  TempSrc: Oral  Weight: 169 lb 3.2 oz (76.7 kg)  Height: 5' 2"  (1.575 m)   Physical Exam Constitutional:      General: She is not in acute distress. Cardiovascular:     Rate and Rhythm: Normal rate and regular rhythm.     Heart  sounds: Normal heart sounds.  Pulmonary:     Effort: Pulmonary effort is normal.     Breath sounds: Normal breath sounds.  Abdominal:     General: Bowel sounds are normal.     Palpations: Abdomen is soft.  Skin:    General: Skin is warm and dry.  Neurological:     Mental Status: She is alert and oriented to person, place, and time.   Breast exam: No distinct palpable mass in the left breast but there is some bruising and induration noted at the site of biopsy.  No palpable breast masses in the right breast.  No palpable bilateral axillary adenopathy.    No flowsheet data found. No flowsheet data found.  No images are attached to the encounter.  US BREAST LTD UNI LEFT INC AXILLA  Result Date: 11/30/2019 CLINICAL DATA:  79 year old female recalled from screening mammogram dated 11/15/2019 for a possible left breast mass. EXAM: DIGITAL DIAGNOSTIC LEFT MAMMOGRAM WITH CAD AND TOMO ULTRASOUND LEFT BREAST COMPARISON:  Previous exam(s). ACR Breast Density Category b: There are scattered areas of fibroglandular density. FINDINGS: There is a persistent oval, partially circumscribed partially obscured equal density mass in the medial left breast at anterior depth. Further evaluation with ultrasound was performed. Mammographic images were processed with CAD. Targeted ultrasound is performed, showing a slightly irregular, hypoechoic mass at the 3 o'clock position 1 cm from the nipple within the left breast. It measures 2.0 x 1.9 x 0.6 cm. There is no internal vascularity. This correlates well with the mammographic finding. Additional mildly ectatic ducts are seen in the vicinity leading to the nipple. No additional suspicious findings identified. Evaluation of the left axilla demonstrates no suspicious lymphadenopathy. IMPRESSION: 1. Indeterminate left breast mass at the 3 o'clock position corresponding with the screening mammographic findings. Recommendation is for ultrasound-guided biopsy. 2. No  suspicious left axillary lymphadenopathy. RECOMMENDATION: Ultrasound-guided biopsy of the left breast. I have discussed the findings and recommendations with the patient. If applicable, a reminder letter will be sent to the patient regarding the next appointment. BI-RADS CATEGORY  4: Suspicious. Electronically Signed   By: Kristopher Oppenheim M.D.   On: 11/30/2019 15:17   MM DIAG BREAST TOMO UNI LEFT  Result Date: 11/30/2019 CLINICAL DATA:  79 year old female recalled from screening mammogram dated 11/15/2019 for a possible left breast mass. EXAM: DIGITAL DIAGNOSTIC LEFT MAMMOGRAM WITH CAD AND TOMO ULTRASOUND LEFT BREAST COMPARISON:  Previous exam(s). ACR Breast Density Category b: There are scattered areas of fibroglandular density. FINDINGS: There is a persistent oval, partially circumscribed partially obscured equal density mass in the medial left breast at anterior depth. Further evaluation with ultrasound was performed. Mammographic images were processed with CAD. Targeted ultrasound is performed, showing a slightly irregular, hypoechoic mass at the 3 o'clock position 1 cm from the nipple within the left breast. It measures 2.0 x 1.9 x 0.6 cm. There is no internal vascularity. This correlates well with the mammographic finding. Additional mildly ectatic ducts are seen in the vicinity leading to the nipple. No additional suspicious findings identified. Evaluation of the left axilla demonstrates no suspicious lymphadenopathy. IMPRESSION: 1. Indeterminate left breast mass at the 3 o'clock position corresponding with the screening mammographic findings. Recommendation is for ultrasound-guided biopsy. 2. No suspicious left axillary lymphadenopathy. RECOMMENDATION: Ultrasound-guided biopsy of the left breast. I have discussed the findings and recommendations with the patient. If applicable, a reminder letter will be sent to the patient regarding the next appointment. BI-RADS CATEGORY  4: Suspicious. Electronically  Signed   By: Kristopher Oppenheim M.D.   On: 11/30/2019 15:17   MM CLIP PLACEMENT LEFT  Result Date: 12/03/2019 CLINICAL DATA:  Status post ultrasound-guided biopsy left breast mass 3 o'clock position. EXAM: DIAGNOSTIC LEFT MAMMOGRAM POST ULTRASOUND BIOPSY COMPARISON:  Previous exam(s). FINDINGS: Mammographic images were obtained following ultrasound guided biopsy of left breast mass 3 o'clock position. The biopsy marking clip is in expected position at the site of biopsy. IMPRESSION: Appropriate positioning of the vision shaped biopsy marking clip at the site of biopsy in the left breast mass 3 o'clock position. Final Assessment: Post Procedure Mammograms for Marker Placement  Electronically Signed   By: Lovey Newcomer M.D.   On: 12/03/2019 11:41   [place] LT BREAST BX W LOC DEV 1ST LESION IMG BX SPEC US GUIDE  Addendum Date: 12/07/2019   ADDENDUM REPORT: 12/07/2019 11:11 ADDENDUM: PATHOLOGY revealed: A. BREAST, LEFT, 3 O'CLOCK 1 CM FROM NIPPLE; ULTRASOUND-GUIDED CORE BIOPSY: - DUCTAL CARCINOMA IN SITU (DCIS), INTERMEDIATE NUCLEAR GRADE WITHOUT NECROSIS, INVOLVING A FIBROADENOMA. Comment: The small amount of tissue adjacent to the fibroadenoma does not contain any ducts, so it cannot be determined from this sample whether the DCIS is limited to the fibroadenoma or is present in the surrounding breast tissue. Pathology results are CONCORDANT with imaging findings, per Dr. Lovey Newcomer. Pathology results and recommendations below were discussed with patient by telephone on 12/06/2019. Patient reported biopsy site within normal limits with slight tenderness at the site. Post biopsy care instructions were reviewed, questions were answered and my direct phone number was provided to patient. Patient was instructed to call [facility] if any concerns or questions arise related to the biopsy. Recommendation: Request for surgical consultation was relayed to Al Pimple RN and Tanya Nones RN at [facility] by Electa Sniff RN on 12/06/2019. Pathology results reported by Electa Sniff RN on 12/07/2019. ++ Recommend including the adjacent calcifications within the upper-outer left breast at the time of lumpectomy.++ Electronically Signed   By: Lovey Newcomer M.D.   On: 12/07/2019 11:11   Result Date: 12/07/2019 CLINICAL DATA:  Patient with indeterminate left breast mass 3 o'clock position. EXAM: ULTRASOUND GUIDED LEFT BREAST CORE NEEDLE BIOPSY COMPARISON:  Previous exam(s). PROCEDURE: I met with the patient and we discussed the procedure of ultrasound-guided biopsy, including benefits and alternatives. We discussed the high likelihood of a successful procedure. We discussed the risks of the procedure, including infection, bleeding, tissue injury, clip migration, and inadequate sampling. Informed written consent was given. The usual time-out protocol was performed immediately prior to the procedure. Lesion quadrant: Lower outer quadrant Using sterile technique and 1% Lidocaine as local anesthetic, under direct ultrasound visualization, a 14 gauge spring-loaded device was used to perform biopsy of left breast mass 3 o'clock position using a lateral approach. At the conclusion of the procedure vision tissue marker clip was deployed into the biopsy cavity. Follow up 2 view mammogram was performed and dictated separately. IMPRESSION: Ultrasound guided biopsy of left breast mass 3 o'clock position. No apparent complications. Electronically Signed: By: Lovey Newcomer M.D. On: 12/03/2019 11:40    Assessment and plan- Patient is a 79 y.o. female with new diagnosis of left breast DCIS  Discussed the results of mammogram and biopsy with the patient in detail.  Mammogram showed a 3 cm mass in the left breast which was biopsied and was consistent with intermediate grade DCIS.  I would recommend upfront lumpectomy and possible sentinel lymph node biopsy.  She has already discussed surgical options with Dr. Bary Castilla.    Explained the  natural course of DCIS and the risk of progression to invasive carcinoma.  I do recommend consideration for adjuvant radiation therapy as well as antiestrogen therapy for 5 years if tolerated and final pathology shows ER positive DCIS.  I have briefly discussed tamoxifen versus AI for 5 years.  I do think that patient will be a good candidate for AI.  I will see her following her lumpectomy to discuss final pathology and further management.  Treatment will be given with a curative intent.  Patient understands and agrees to proceed as  planned  Thank you for this kind referral and the opportunity to participate in the care of this  Patient   Visit Diagnosis 1. Goals of care, counseling/discussion   2. Ductal carcinoma in situ (DCIS) of left breast     Dr. Randa Evens, MD, MPH [facility] [phone] 12/16/2019  6:14 PM

## 2019-12-22 ENCOUNTER — Other Ambulatory Visit: Payer: Self-pay

## 2019-12-22 ENCOUNTER — Encounter
Admission: RE | Admit: 2019-12-22 | Discharge: 2019-12-22 | Disposition: A | Discharge status: Home / Self Care | Payer: Medicare HMO | Source: Ambulatory Visit | Attending: General Surgery | Admitting: General Surgery

## 2019-12-22 NOTE — Patient Instructions (Signed)
Your procedure is scheduled on: Thursday December 29, 2019. Report to Day Surgery inside [facility] 2nd floor. To find out your arrival time please call [phone] between 1PM - 3PM on Wednesday December 28, 2019.  Remember: Instructions that are not followed completely may result in serious medical risk,  up to and including death, or upon the discretion of your surgeon and anesthesiologist your  surgery may need to be rescheduled.     _X__ 1. Do not eat food after midnight the night before your procedure.                 No chewing gum or hard candies. You may drink clear liquids up to 2 hours                 before you are scheduled to arrive for your surgery- DO not drink clear                 liquids within 2 hours of the start of your surgery.                 Clear Liquids include:  water, apple juice without pulp, clear Gatorade, G2 or                  Gatorade Zero (avoid Red/Purple/Blue), Black Coffee or Tea (Do not add                 anything to coffee or tea).  __X__2.  On the morning of surgery brush your teeth with toothpaste and water, you                may rinse your mouth with mouthwash if you wish.  Do not swallow any toothpaste of mouthwash.     _X__ 3.  No Alcohol for 24 hours before or after surgery.   _X__ 4.  Do Not Smoke or use e-cigarettes For 24 Hours Prior to Your Surgery.                 Do not use any chewable tobacco products for at least 6 hours prior to                 Surgery.  _X__  5.  Do not use any recreational drugs (marijuana, cocaine, heroin, ecstasy, MDMA or other)                For at least one week prior to your surgery.  Combination of these drugs with anesthesia                May have life threatening results.  __X__ 6.  Notify your doctor if there is any change in your medical condition      (cold, fever, infections).     Do not wear jewelry, make-up, hairpins, clips or nail polish. Do not wear lotions,  powders, or perfumes. You may wear deodorant. Do not shave 48 hours prior to surgery. Men may shave face and neck. Do not bring valuables to the hospital.    [facility] is not responsible for any belongings or valuables.  Contacts, dentures or bridgework may not be worn into surgery. Leave your suitcase in the car. After surgery it may be brought to your room. For patients admitted to the hospital, discharge time is determined by your treatment team.   Patients discharged the day of surgery will not be allowed to drive home.   Make arrangements for someone to be  with you for the first 24 hours of your Same Day Discharge.   __x__ Take these medicines the morning of surgery with A SIP OF WATER:    1. carbidopa-levodopa (SINEMET IR) 25-100 MG   __x__ Use CHG Soap (or wipes) as directed  __x__ Stop aspirin 7 days before your procedure.   __x__ Stop Anti-inflammatories such as Ibuprofen, Aleve, Advil, naproxen, and or BC powders.    __x__ Stop supplements until after surgery.    __x__  Do not start any herbal supplements before your procedure.    If you have any questions regarding your pre-procedure instructions,  Please call Pre-admit Testing at [phone].

## 2019-12-27 ENCOUNTER — Other Ambulatory Visit: Payer: Self-pay

## 2019-12-27 ENCOUNTER — Other Ambulatory Visit
Admission: RE | Admit: 2019-12-27 | Discharge: 2019-12-27 | Disposition: A | Discharge status: Home / Self Care | Payer: Medicare HMO | Source: Ambulatory Visit | Attending: General Surgery | Admitting: General Surgery

## 2019-12-27 DIAGNOSIS — Z20822 Contact with and (suspected) exposure to covid-19: Secondary | ICD-10-CM | POA: Insufficient documentation

## 2019-12-27 DIAGNOSIS — Z01818 Encounter for other preprocedural examination: Secondary | ICD-10-CM | POA: Diagnosis present

## 2019-12-27 LAB — SARS CORONAVIRUS 2 (TAT 6-24 HRS): SARS Coronavirus 2: NEGATIVE

## 2019-12-27 NOTE — Progress Notes (Addendum)
[facility] Perioperative Services  Pre-Admission/Anesthesia Testing Clinical Review  Date: 12/27/19  Patient Demographics:  Name: Sheila Mitchell DOB:   29-Jul-1940 MRN:   [id number]  Planned Surgical Procedure(s):    Case: [id number] Date/Time: 12/29/19 1318   Procedure: BREAST LUMPECTOMY (Left )   Anesthesia type: General   Pre-op diagnosis: DCIS left breast   Location: [facility] OR ROOM 02 / [place] ORS FOR ANESTHESIA GROUP   Surgeons: Robert Bellow, MD      NOTE: Available PAT nursing documentation and vital signs have been reviewed. Clinical nursing staff has updated patient's PMH/PSHx, current medication list, and drug allergies/intolerances to ensure comprehensive history available to assist in medical decision making as it pertains to the aforementioned surgical procedure and anticipated anesthetic course.   Clinical Discussion:  Sheila Mitchell is a 79 y.o. female who is submitted for pre-surgical anesthesia review and clearance prior to her undergoing the above procedure. Patient has never been a smoker. Pertinent PMH includes: HTN, valvular heart disease, Parkinson's disease with associated gait abnormality, breast cancer, thoracic disc disease.  Patient is followed by cardiology Humphrey Rolls, MD). She was last seen in the cardiology clinic on 08/10/2019; notes reviewed.  At that time, patient was doing well from a cardiovascular standpoint.  Patient denied any chest pain, shortness of breath, PND, orthopnea, peripheral edema, palpitations, vertiginous symptoms, or presyncope/syncope.  Patient with Parkinson's diagnosis and recent neurology notes indicated worsening symptoms; antiparkinsonian medication increased and patient referred to physical therapy.  Past medical history significant for known valvular insufficiency.  Patient underwent nuclear stress test on 04/01/2019 following syncopal episode.  Testing revealed no perfusion defects, normal wall motion, and an  LVEF of 93%.  Holter monitor study was recommended by cardiology, however patient refused.  Most recent TTE performed on 08/10/2019 revealed normal LV systolic function with an LVEF of 70%; varying degrees of valvular insufficiency noted (see full interpretation of cardiovascular testing below).  Documented functional capacity per the DASI is >4 METS. Hypertension well controlled on ACEi and daily diuretic therapy.  Patient is on a statin for her HLD.  No significant changes were noted in patient's past medical history; no changes to medication regimen.  Patient to follow-up with outpatient cardiology.  Patient recently underwent a core needle biopsy on 12/03/2019.  Pathology returned as DCIS.  She was seen in consultation on 12/07/2019 by Dr. Hervey Ard, and the decision was made for the patient undergo lumpectomy, for which she is scheduled to have on 12/29/2019.  Patient was also seen in consult by medical oncology Janese Banks, MD). Discussed plans for consideration of adjuvant XRT in addition to endocrine therapy for 5 years should final pathology showed ER (+) DCIS; intent therapy is curative.  Given patient's past medical history and known cardiac valvular insufficiency, presurgical cardiac clearance was sought by the PAT team.  Per cardiology, "patient is optimized for surgery and may proceed with planned procedure with a LOW risk stratification". This patient is on daily antiplatelet therapy. She has been instructed on recommendations for holding her daily low-dose ASA for 7 days prior to her procedure. The patient has been instructed that her last dose of her anticoagulant will be on 12/22/2019. Neurology Aliene Beams, MD) also has cleared patient for surgery with a LOW risk stratification and has asked that she continue her Sinemet as previously prescribed.   She denies previous perioperative complications with anesthesia. She underwent a MAC anesthetic course at [facility] (ASA III) in 08/2019 with no documented  complications.  Vitals with BMI 12/22/2019 12/13/2019 07/08/2019  Height _0  _1  _2   Weight 163 lbs 169 lbs 3 oz 171 lbs 6 oz  BMI 29.81 09.38 18.29  Systolic - 937 169  Diastolic - 84 72  Pulse - 64 72    Providers/Specialists:   NOTE: Primary physician provider listed below. Patient may have been seen by APP or partner within same practice.   PROVIDER ROLE LAST Desma Mcgregor, MD General Surgery 12/16/2019  Danelle Berry, NP Primary Care Provider ?  Neoma Laming, MD Cardiology 08/10/2019  Randa Evens, MD Oncology 12/13/2019  Marcial Pacas, MD Neurology 08/24/2019   Allergies:  Patient has no known allergies.  Current Home Medications:   No current facility-administered medications for this encounter.   Marland Kitchen aspirin EC 81 MG tablet  . Calcium Carb-Cholecalciferol (CALCIUM + D3 PO)  . carbidopa-levodopa (SINEMET IR) 25-100 MG tablet  . cholecalciferol (VITAMIN D3) 25 MCG (1000 UNIT) tablet  . Coenzyme Q10 (CO Q10) 200 MG CAPS  . lisinopril (PRINIVIL,ZESTRIL) 5 MG tablet  . Multiple Vitamin (MULTIVITAMIN) tablet  . olopatadine (PATADAY) 0.1 % ophthalmic solution  . Omega 3 1200 MG CAPS  . simvastatin (ZOCOR) 20 MG tablet  . torsemide (DEMADEX) 20 MG tablet  . Coenzyme Q10 (CO Q 10 PO)   History:   Past Medical History:  Diagnosis Date  . Basal cell carcinoma    basal cell on left nose  . Breast cancer ([place])   . Heart disease   . Hypertension   . Leaky heart valve   . Parkinson disease ([place])   . Ruptured disc, thoracic   . Tremor    Past Surgical History:  Procedure Laterality Date  . ABDOMINAL HYSTERECTOMY     complete  . APPENDECTOMY     at the same time of hysterectomy  . BASAL CELL CARCINOMA EXCISION     nose  . CHOLECYSTECTOMY    . EYE SURGERY  2021  . TONSILLECTOMY     Family History  Problem Relation Age of Onset  . COPD Mother   . Heart disease Father   . Diabetes Paternal Grandmother   . Breast cancer Neg Hx    Social  History   Tobacco Use  . Smoking status: Never Smoker  . Smokeless tobacco: Never Used  Vaping Use  . Vaping Use: Never used  Substance Use Topics  . Alcohol use: Not Currently  . Drug use: Never    Pertinent Clinical Results:  LABS: Reviewed as acceptable for surgery  Routine labs performed by PCP on 11/29/2019 as follows: Glucose 84 BUN 18 Creatinine 0.85 Sodium 139 Potassium 4.0  WBC 6.3 Hemoglobin 13.2 Hematocrit 41.3 MCV 92 MCH 29.4 Platelets 253  Total cholesterol 159 Triglycerides 88 HDL 66 VLDL 16 LDL 77  TSH 1.770  IMAGING / PROCEDURES: ECHOCARDIOGRAM done on 08/10/2019 1. LVEF 70% 2. Severely dilated LA with LV, R AAA, RV, and aorta appear normal in size 3. Normal LV systolic function 4. Normal LV wall motion 5. Mild LVH with grade 1 diastolic dysfunction 6. RV diastolic dysfunction 7. Mild PV regurgitation 8. Mild TV regurgitation 9. Normal PASP 10. Mild MV regurgitation 11. 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 No valvular stenosis; mean AV gradient 7.0 mmHg 13. No pericardial effusion  NUCLEAR STRESS TEST done on 04/01/2019 1. No perfusion defects 2. No RWMA's 3. Normal stress test with normal LVEF of 93%  Impression and Plan:  Sheila Mitchell  has been referred for pre-anesthesia review and clearance prior to her undergoing the planned anesthetic and procedural courses. Available labs, pertinent testing, and imaging results were personally reviewed by me. This patient has been appropriately cleared by cardiology Humphrey Rolls, MD) and neurology Aliene Beams, MD).   Based on clinical review performed today (12/27/19), barring any significant acute changes in the patient's overall condition, it is anticipated that she will be able to proceed with the planned surgical intervention. Any acute changes in clinical condition may necessitate her procedure being postponed and/or cancelled. Pre-surgical instructions were reviewed with the patient during her  PAT appointment and questions were fielded by PAT clinical staff.  Honor Loh, MSN, APRN, FNP-C, CEN [facility]  Peri-operative Services Nurse Practitioner Phone: [phone] 12/27/19 9:50 AM  NOTE: This note has been prepared using Dragon dictation software. Despite my best ability to proofread, there is always the potential that unintentional transcriptional errors may still occur from this process.

## 2019-12-28 ENCOUNTER — Other Ambulatory Visit: Payer: Self-pay | Admitting: General Surgery

## 2019-12-28 DIAGNOSIS — D0512 Intraductal carcinoma in situ of left breast: Secondary | ICD-10-CM

## 2019-12-29 ENCOUNTER — Ambulatory Visit
Admission: RE | Admit: 2019-12-29 | Discharge: 2019-12-29 | Disposition: A | Discharge status: Home / Self Care | Payer: Medicare HMO | Source: Ambulatory Visit | Attending: General Surgery | Admitting: General Surgery

## 2019-12-29 ENCOUNTER — Ambulatory Visit: Payer: Medicare HMO | Admitting: Urgent Care

## 2019-12-29 ENCOUNTER — Encounter: Payer: Self-pay | Admitting: General Surgery

## 2019-12-29 ENCOUNTER — Ambulatory Visit
Admission: RE | Admit: 2019-12-29 | Discharge: 2019-12-29 | Disposition: A | Discharge status: Home / Self Care | Payer: Medicare HMO | Attending: General Surgery | Admitting: General Surgery

## 2019-12-29 ENCOUNTER — Other Ambulatory Visit: Payer: Self-pay

## 2019-12-29 ENCOUNTER — Encounter
Admission: RE | Disposition: A | Discharge status: Home / Self Care | Payer: Self-pay | Source: Home / Self Care | Attending: General Surgery

## 2019-12-29 DIAGNOSIS — Z7982 Long term (current) use of aspirin: Secondary | ICD-10-CM | POA: Diagnosis not present

## 2019-12-29 DIAGNOSIS — D242 Benign neoplasm of left breast: Secondary | ICD-10-CM | POA: Insufficient documentation

## 2019-12-29 DIAGNOSIS — G20 Parkinson's disease: Secondary | ICD-10-CM | POA: Diagnosis not present

## 2019-12-29 DIAGNOSIS — Z79899 Other long term (current) drug therapy: Secondary | ICD-10-CM | POA: Insufficient documentation

## 2019-12-29 DIAGNOSIS — Z85828 Personal history of other malignant neoplasm of skin: Secondary | ICD-10-CM | POA: Diagnosis not present

## 2019-12-29 DIAGNOSIS — I10 Essential (primary) hypertension: Secondary | ICD-10-CM | POA: Diagnosis not present

## 2019-12-29 DIAGNOSIS — D0512 Intraductal carcinoma in situ of left breast: Secondary | ICD-10-CM | POA: Insufficient documentation

## 2019-12-29 DIAGNOSIS — R928 Other abnormal and inconclusive findings on diagnostic imaging of breast: Secondary | ICD-10-CM | POA: Diagnosis not present

## 2019-12-29 HISTORY — PX: BREAST LUMPECTOMY: SHX2

## 2019-12-29 SURGERY — BREAST LUMPECTOMY
Anesthesia: General | Site: Breast | Laterality: Left

## 2019-12-29 MED ORDER — EPINEPHRINE PF 1 MG/ML IJ SOLN
INTRAMUSCULAR | Status: AC
  Filled 2019-12-29: qty 1

## 2019-12-29 MED ORDER — GLYCOPYRROLATE 0.2 MG/ML IJ SOLN
INTRAMUSCULAR | Status: DC | PRN
  Administered 2019-12-29: .2 mg via INTRAVENOUS

## 2019-12-29 MED ORDER — CHLORHEXIDINE GLUCONATE 0.12 % MT SOLN
OROMUCOSAL | Status: AC
  Filled 2019-12-29: qty 15

## 2019-12-29 MED ORDER — LIDOCAINE HCL (CARDIAC) PF 100 MG/5ML IV SOSY
PREFILLED_SYRINGE | INTRAVENOUS | Status: DC | PRN
  Administered 2019-12-29: 80 mg via INTRAVENOUS

## 2019-12-29 MED ORDER — BUPIVACAINE-EPINEPHRINE (PF) 0.5% -1:200000 IJ SOLN
INTRAMUSCULAR | Status: DC | PRN
  Administered 2019-12-29: 30 mL

## 2019-12-29 MED ORDER — FAMOTIDINE 20 MG PO TABS
ORAL_TABLET | ORAL | Status: AC
  Filled 2019-12-29: qty 1

## 2019-12-29 MED ORDER — CHLORHEXIDINE GLUCONATE 0.12 % MT SOLN
15.0000 mL | Freq: Once | OROMUCOSAL | Status: AC
  Administered 2019-12-29: 15 mL via OROMUCOSAL

## 2019-12-29 MED ORDER — DEXAMETHASONE SODIUM PHOSPHATE 10 MG/ML IJ SOLN
INTRAMUSCULAR | Status: DC | PRN
  Administered 2019-12-29: 10 mg via INTRAVENOUS

## 2019-12-29 MED ORDER — ACETAMINOPHEN 325 MG PO TABS
650.0000 mg | ORAL_TABLET | Freq: Once | ORAL | Status: AC
  Administered 2019-12-29: 650 mg via ORAL

## 2019-12-29 MED ORDER — ACETAMINOPHEN 10 MG/ML IV SOLN
INTRAVENOUS | Status: AC
  Filled 2019-12-29: qty 100

## 2019-12-29 MED ORDER — HYDROCODONE-ACETAMINOPHEN 5-325 MG PO TABS: 1.0000 | ORAL_TABLET | ORAL | 0 refills | Status: DC | PRN

## 2019-12-29 MED ORDER — ACETAMINOPHEN 325 MG PO TABS
ORAL_TABLET | ORAL | Status: AC
  Filled 2019-12-29: qty 2

## 2019-12-29 MED ORDER — ACETAMINOPHEN 10 MG/ML IV SOLN
INTRAVENOUS | Status: DC | PRN
  Administered 2019-12-29: 1000 mg via INTRAVENOUS

## 2019-12-29 MED ORDER — PHENYLEPHRINE HCL (PRESSORS) 10 MG/ML IV SOLN
INTRAVENOUS | Status: DC | PRN
  Administered 2019-12-29 (×2): 100 ug via INTRAVENOUS
  Administered 2019-12-29: 200 ug via INTRAVENOUS
  Administered 2019-12-29: 100 ug via INTRAVENOUS
  Administered 2019-12-29: 200 ug via INTRAVENOUS

## 2019-12-29 MED ORDER — FAMOTIDINE 20 MG PO TABS
20.0000 mg | ORAL_TABLET | Freq: Once | ORAL | Status: AC
  Administered 2019-12-29: 20 mg via ORAL

## 2019-12-29 MED ORDER — LACTATED RINGERS IV SOLN
INTRAVENOUS | Status: DC
  Administered 2019-12-29: 1000 mL via INTRAVENOUS

## 2019-12-29 MED ORDER — ORAL CARE MOUTH RINSE: 15.0000 mL | Freq: Once | OROMUCOSAL | Status: AC

## 2019-12-29 MED ORDER — ONDANSETRON HCL 4 MG/2ML IJ SOLN
INTRAMUSCULAR | Status: DC | PRN
  Administered 2019-12-29: 4 mg via INTRAVENOUS

## 2019-12-29 MED ORDER — PROPOFOL 10 MG/ML IV BOLUS
INTRAVENOUS | Status: DC | PRN
  Administered 2019-12-29: 120 mg via INTRAVENOUS
  Administered 2019-12-29: 30 mg via INTRAVENOUS

## 2019-12-29 MED ORDER — ONDANSETRON HCL 4 MG/2ML IJ SOLN: 4.0000 mg | Freq: Once | INTRAMUSCULAR | Status: DC | PRN

## 2019-12-29 MED ORDER — FENTANYL CITRATE (PF) 100 MCG/2ML IJ SOLN
INTRAMUSCULAR | Status: AC
  Filled 2019-12-29: qty 2

## 2019-12-29 MED ORDER — FENTANYL CITRATE (PF) 100 MCG/2ML IJ SOLN: 25.0000 ug | INTRAMUSCULAR | Status: DC | PRN

## 2019-12-29 MED ORDER — BUPIVACAINE HCL (PF) 0.5 % IJ SOLN
INTRAMUSCULAR | Status: AC
  Filled 2019-12-29: qty 30

## 2019-12-29 MED ORDER — FENTANYL CITRATE (PF) 100 MCG/2ML IJ SOLN
INTRAMUSCULAR | Status: DC | PRN
Start: 2019-12-29 — End: 2019-12-30
  Administered 2019-12-29: 50 ug via INTRAVENOUS

## 2019-12-29 SURGICAL SUPPLY — 53 items
APL PRP STRL LF DISP 70% ISPRP (MISCELLANEOUS) ×1
BINDER BREAST LRG (GAUZE/BANDAGES/DRESSINGS) IMPLANT
BINDER BREAST MEDIUM (GAUZE/BANDAGES/DRESSINGS) IMPLANT
BINDER BREAST XLRG (GAUZE/BANDAGES/DRESSINGS) IMPLANT
BINDER BREAST XXLRG (GAUZE/BANDAGES/DRESSINGS) IMPLANT
BLADE PHOTON ILLUMINATED (MISCELLANEOUS) IMPLANT
BLADE SURG 15 STRL SS SAFETY (BLADE) ×6 IMPLANT
BULB RESERV EVAC DRAIN JP 100C (MISCELLANEOUS) IMPLANT
CANISTER SUCT 1200ML W/VALVE (MISCELLANEOUS) ×3 IMPLANT
CHLORAPREP W/TINT 26 (MISCELLANEOUS) ×3 IMPLANT
CLOSURE WOUND 1/2 X4 (GAUZE/BANDAGES/DRESSINGS) ×1
CNTNR SPEC 2.5X3XGRAD LEK (MISCELLANEOUS)
CONT SPEC 4OZ STER OR WHT (MISCELLANEOUS)
CONT SPEC 4OZ STRL OR WHT (MISCELLANEOUS)
CONTAINER SPEC 2.5X3XGRAD LEK (MISCELLANEOUS) IMPLANT
COVER PROBE FLX POLY STRL (MISCELLANEOUS) ×3 IMPLANT
COVER WAND RF STERILE (DRAPES) ×3 IMPLANT
DEVICE DUBIN SPECIMEN MAMMOGRA (MISCELLANEOUS) ×3 IMPLANT
DRAIN CHANNEL JP 15F RND 16 (MISCELLANEOUS) IMPLANT
DRAPE LAPAROTOMY TRNSV 106X77 (MISCELLANEOUS) ×3 IMPLANT
DRSG GAUZE FLUFF 36X18 (GAUZE/BANDAGES/DRESSINGS) ×3 IMPLANT
DRSG TELFA 3X8 NADH (GAUZE/BANDAGES/DRESSINGS) ×3 IMPLANT
ELECT CAUTERY BLADE TIP 2.5 (TIP) ×6
ELECT REM PT RETURN 9FT ADLT (ELECTROSURGICAL) ×3
ELECTRODE CAUTERY BLDE TIP 2.5 (TIP) ×2 IMPLANT
ELECTRODE REM PT RTRN 9FT ADLT (ELECTROSURGICAL) ×1 IMPLANT
GLOVE BIO SURGEON STRL SZ7.5 (GLOVE) ×3 IMPLANT
GLOVE INDICATOR 8.0 STRL GRN (GLOVE) ×3 IMPLANT
GOWN STRL REUS W/ TWL LRG LVL3 (GOWN DISPOSABLE) ×2 IMPLANT
GOWN STRL REUS W/TWL LRG LVL3 (GOWN DISPOSABLE) ×6
KIT TURNOVER KIT A (KITS) ×3 IMPLANT
LABEL OR SOLS (LABEL) ×3 IMPLANT
MARGIN MAP 10MM (MISCELLANEOUS) ×3 IMPLANT
NEEDLE HYPO 22GX1.5 SAFETY (NEEDLE) ×3 IMPLANT
NEEDLE HYPO 25X1 1.5 SAFETY (NEEDLE) ×6 IMPLANT
PACK BASIN MINOR (MISCELLANEOUS) ×3 IMPLANT
SHEARS FOC LG CVD HARMONIC 17C (MISCELLANEOUS) IMPLANT
SHEARS HARMONIC 9CM CVD (BLADE) IMPLANT
STRIP CLOSURE SKIN 1/2X4 (GAUZE/BANDAGES/DRESSINGS) ×2 IMPLANT
SUT ETHILON 3-0 FS-10 30 BLK (SUTURE) ×3
SUT SILK 2 0 (SUTURE) ×3
SUT SILK 2-0 18XBRD TIE 12 (SUTURE) ×1 IMPLANT
SUT VIC AB 2-0 CT1 27 (SUTURE) ×6
SUT VIC AB 2-0 CT1 TAPERPNT 27 (SUTURE) ×2 IMPLANT
SUT VIC AB 4-0 FS2 27 (SUTURE) ×3 IMPLANT
SUT VICRYL+ 3-0 144IN (SUTURE) IMPLANT
SUTURE EHLN 3-0 FS-10 30 BLK (SUTURE) ×1 IMPLANT
SWABSTK COMLB BENZOIN TINCTURE (MISCELLANEOUS) ×3 IMPLANT
SYR 10ML LL (SYRINGE) ×3 IMPLANT
SYR BULB IRRIG 60ML STRL (SYRINGE) ×3 IMPLANT
SYR CONTROL 10ML LL (SYRINGE) ×3 IMPLANT
TAPE TRANSPORE STRL 2 31045 (GAUZE/BANDAGES/DRESSINGS) IMPLANT
WATER STERILE IRR 1000ML POUR (IV SOLUTION) ×3 IMPLANT

## 2019-12-29 NOTE — Op Note (Signed)
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  Postoperative diagnosis: Same.  Operative procedure: Wide local excision with ultrasound guidance.  Operating surgeon: Hervey Ard, MD.  Anesthesia: General by LMA, Marcaine 0.5% with 1: 200,000 notes of epinephrine.  Estimated blood loss: Less than 5 cc.  Clinical note: This 79 year old woman had an abnormal mammogram and biopsy showed evidence of intermediate grade DCIS.  She desired breast conservation.  Operative note: The patient underwent general anesthesia and tolerated this well.  The breast was cleansed with ChloraPrep and draped.  Ultrasound was used to outline the borders of the lesion in the 3 o'clock position of the left breast center just under the edge of the areola.  Images were obtained for permanent storage.  Local anesthesia was infiltrated.  As the area was within about 4-5 mm of the skin it was elected to take an ellipse of skin.  This extended from the base of the nipple outward at the 3 o'clock position in a radial fashion.  The skin was incised sharply and the rest of the dissection completed with electrocautery.  A 3 x 5 x 3 cm block of tissue was excised, orientated and specimen radiograph obtained in the operating room showing the clip within the central portion of the specimen.  Gross examination reported by pathology showed margins to be clear.  After achieving hemostasis the deep tissue was elevated circumferentially and approximated with interrupted 2-0 Vicryl figure-of-eight sutures.  There was some tethering of the soft tissue inferiorly and a flap was raised from the base of the nipple down to just above the inframammary fold.  This remove the small ripple in the skin.  2 additional layers were approximated with 2-0 Vicryl and then the skin closed with a running 4-0 Vicryl subcuticular suture.  Benzoin and Steri-Strips followed by Telfa, fluff gauze and a compressive wrap were applied.  The  patient tolerated the procedure well and was taken to recovery in stable condition.

## 2019-12-29 NOTE — Anesthesia Procedure Notes (Signed)
Procedure Name: LMA Insertion Date/Time: 12/29/2019 1:30 PM Performed by: Aline Brochure, CRNA Pre-anesthesia Checklist: Patient identified, Patient being monitored, Timeout performed, Emergency Drugs available and Suction available Patient Re-evaluated:Patient Re-evaluated prior to induction Oxygen Delivery Method: Circle system utilized Preoxygenation: Pre-oxygenation with 100% oxygen Induction Type: IV induction Ventilation: Mask ventilation without difficulty LMA: LMA inserted LMA Size: 3.5 Tube type: Oral Number of attempts: 1 Placement Confirmation: positive ETCO2 and breath sounds checked- equal and bilateral Tube secured with: Tape Dental Injury: Teeth and Oropharynx as per pre-operative assessment

## 2019-12-29 NOTE — Discharge Instructions (Signed)

## 2019-12-29 NOTE — H&P (Signed)
Sheila Mitchell [id number] April 14, 1940     HPI:  Patient with intermediate grade DCIS. For wide excision.   Medications Prior to Admission  Medication Sig Dispense Refill Last Dose  . aspirin EC 81 MG tablet Take 81 mg by mouth daily.   Past Week at Unknown time  . Calcium Carb-Cholecalciferol (CALCIUM + D3 PO) Take 1 tablet by mouth daily.    Past Week at Unknown time  . carbidopa-levodopa (SINEMET IR) 25-100 MG tablet Take 1 tablet by mouth 3 (three) times daily. 90 tablet 11 12/29/2019 at Unknown time  . cholecalciferol (VITAMIN D3) 25 MCG (1000 UNIT) tablet Take 1,000 Units by mouth daily.   Past Week at Unknown time  . Coenzyme Q10 (CO Q10) 200 MG CAPS Take 200 mg by mouth daily.   Past Week at Unknown time  . lisinopril (PRINIVIL,ZESTRIL) 5 MG tablet Take 5 mg by mouth daily.   12/28/2019 at Unknown time  . Multiple Vitamin (MULTIVITAMIN) tablet Take 1 tablet by mouth daily.   Past Week at Unknown time  . olopatadine (PATADAY) 0.1 % ophthalmic solution Place 1 drop into both eyes 2 (two) times daily.   12/29/2019 at Unknown time  . Omega 3 1200 MG CAPS Take 1,200 mg by mouth daily.   Past Week at Unknown time  . simvastatin (ZOCOR) 20 MG tablet Take 20 mg by mouth at bedtime.    12/28/2019 at Unknown time  . torsemide (DEMADEX) 20 MG tablet Take 20 mg by mouth daily.   12/28/2019 at Unknown time  . Coenzyme Q10 (CO Q 10 PO) Take 200 mg by mouth daily. (Patient not taking: Reported on 12/17/2019)   Not Taking at Unknown time   No Known Allergies Past Medical History:  Diagnosis Date  . Basal cell carcinoma    basal cell on left nose  . Breast cancer ([place])   . Heart disease   . Hypertension   . Leaky heart valve   . Parkinson disease ([place])   . Ruptured disc, thoracic   . Tremor    Past Surgical History:  Procedure Laterality Date  . ABDOMINAL HYSTERECTOMY     complete  . APPENDECTOMY     at the same time of hysterectomy  . BASAL CELL CARCINOMA EXCISION     nose  .  CHOLECYSTECTOMY    . EYE SURGERY  2021  . TONSILLECTOMY     Social History   Socioeconomic History  . Marital status: Widowed    Spouse name: Not on file  . Number of children: 3  . Years of education: one year college  . Highest education level: Not on file  Occupational History  . Occupation: Retired  Tobacco Use  . Smoking status: Never Smoker  . Smokeless tobacco: Never Used  Vaping Use  . Vaping Use: Never used  Substance and Sexual Activity  . Alcohol use: Not Currently  . Drug use: Never  . Sexual activity: Not Currently  Other Topics Concern  . Not on file  Social History Narrative   Lives at home alone.   Right-handed.   2 cups caffeine daily.   Social Determinants of Health   Financial Resource Strain:   . Difficulty of Paying Living Expenses: Not on file  Food Insecurity:   . Worried About Charity fundraiser in the Last Year: Not on file  . Ran Out of Food in the Last Year: Not on file  Transportation Needs:   . Lack of Transportation (Medical):  Not on file  . Lack of Transportation (Non-Medical): Not on file  Physical Activity:   . Days of Exercise per Week: Not on file  . Minutes of Exercise per Session: Not on file  Stress:   . Feeling of Stress : Not on file  Social Connections:   . Frequency of Communication with Friends and Family: Not on file  . Frequency of Social Gatherings with Friends and Family: Not on file  . Attends Religious Services: Not on file  . Active Member of Clubs or Organizations: Not on file  . Attends Archivist Meetings: Not on file  . Marital Status: Not on file  Intimate Partner Violence:   . Fear of Current or Ex-Partner: Not on file  . Emotionally Abused: Not on file  . Physically Abused: Not on file  . Sexually Abused: Not on file   Social History   Social History Narrative   Lives at home alone.   Right-handed.   2 cups caffeine daily.     ROS: Negative.     PE: HEENT: Negative. Lungs:  Clear. Cardio: RR.   Assessment/Plan:  Proceed with planned left breast excision.   [name] 12/29/2019

## 2019-12-29 NOTE — Anesthesia Preprocedure Evaluation (Signed)
Anesthesia Evaluation  Patient identified by MRN, date of birth, ID band Patient awake    Reviewed: Allergy & Precautions, H&P , NPO status , Patient's Chart, lab work & pertinent test results, reviewed documented beta blocker date and time   Airway Mallampati: II  TM Distance: >3 FB Neck ROM: full    Dental  (+) Teeth Intact   Pulmonary neg pulmonary ROS,    Pulmonary exam normal        Cardiovascular Exercise Tolerance: Good hypertension, On Medications negative cardio ROS Normal cardiovascular exam Rate:Normal     Neuro/Psych negative neurological ROS  negative psych ROS   GI/Hepatic negative GI ROS, Neg liver ROS,   Endo/Other  negative endocrine ROS  Renal/GU negative Renal ROS  negative genitourinary   Musculoskeletal   Abdominal   Peds  Hematology negative hematology ROS (+)   Anesthesia Other Findings   Reproductive/Obstetrics negative OB ROS                             Anesthesia Physical Anesthesia Plan  ASA: II  Anesthesia Plan: General LMA   Post-op Pain Management:    Induction:   PONV Risk Score and Plan:   Airway Management Planned:   Additional Equipment:   Intra-op Plan:   Post-operative Plan:   Informed Consent: I have reviewed the patients History and Physical, chart, labs and discussed the procedure including the risks, benefits and alternatives for the proposed anesthesia with the patient or authorized representative who has indicated his/her understanding and acceptance.       Plan Discussed with: CRNA  Anesthesia Plan Comments:         Anesthesia Quick Evaluation

## 2019-12-30 ENCOUNTER — Encounter: Payer: Self-pay | Admitting: General Surgery

## 2019-12-30 NOTE — Anesthesia Postprocedure Evaluation (Signed)
Anesthesia Post Note  Patient: Sheila Mitchell  Procedure(s) Performed: BREAST LUMPECTOMY (Left Breast)  Patient location during evaluation: PACU Anesthesia Type: General Level of consciousness: awake and alert Pain management: pain level controlled Vital Signs Assessment: post-procedure vital signs reviewed and stable Respiratory status: spontaneous breathing, nonlabored ventilation, respiratory function stable and patient connected to nasal cannula oxygen Cardiovascular status: blood pressure returned to baseline and stable Postop Assessment: no apparent nausea or vomiting Anesthetic complications: no   No complications documented.   Last Vitals:  Vitals:   12/29/19 1500 12/29/19 1535  BP: 133/90 129/76  Pulse: 74 73  Resp: 16 16  Temp: 36.7 C   SpO2: 99% 100%    Last Pain:  Vitals:   12/29/19 1535  TempSrc:   PainSc: 2                  Molli Barrows

## 2019-12-30 NOTE — Transfer of Care (Signed)
Immediate Anesthesia Transfer of Care Note  Patient: Sheila Mitchell  Procedure(s) Performed: BREAST LUMPECTOMY (Left Breast)  Patient Location: PACU  Anesthesia Type:General  Level of Consciousness: sedated  Airway & Oxygen Therapy: Patient connected to face mask oxygen  Post-op Assessment: Post -op Vital signs reviewed and stable  Post vital signs: stable  Last Vitals:  Vitals Value Taken Time  BP 129/76 12/29/19 1535  Temp 36.7 C 12/29/19 1500  Pulse 73 12/29/19 1535  Resp 16 12/29/19 1535  SpO2 100 % 12/29/19 1535    Last Pain:  Vitals:   12/29/19 1535  TempSrc:   PainSc: 2          Complications: No complications documented.

## 2020-01-03 ENCOUNTER — Other Ambulatory Visit: Payer: Self-pay | Admitting: Anatomic Pathology & Clinical Pathology

## 2020-01-03 LAB — SURGICAL PATHOLOGY

## 2020-01-06 ENCOUNTER — Ambulatory Visit: Payer: Medicare HMO | Admitting: Neurology

## 2020-01-06 ENCOUNTER — Other Ambulatory Visit: Payer: Self-pay | Admitting: General Surgery

## 2020-01-06 DIAGNOSIS — D0512 Intraductal carcinoma in situ of left breast: Secondary | ICD-10-CM

## 2020-01-10 ENCOUNTER — Inpatient Hospital Stay: Payer: Medicare HMO | Attending: Oncology | Admitting: Oncology

## 2020-01-10 ENCOUNTER — Encounter: Payer: Self-pay | Admitting: Oncology

## 2020-01-10 ENCOUNTER — Ambulatory Visit
Admission: RE | Admit: 2020-01-10 | Discharge: 2020-01-10 | Disposition: A | Discharge status: Home / Self Care | Payer: Medicare HMO | Source: Ambulatory Visit | Attending: Radiation Oncology | Admitting: Radiation Oncology

## 2020-01-10 ENCOUNTER — Encounter: Payer: Self-pay | Admitting: Radiation Oncology

## 2020-01-10 ENCOUNTER — Other Ambulatory Visit: Payer: Self-pay

## 2020-01-10 VITALS — BP 119/57 | HR 65 | Temp 97.2°F | Wt 171.0 lb

## 2020-01-10 VITALS — BP 115/68 | HR 62 | Temp 98.1°F | Resp 16 | Ht 62.0 in | Wt 171.9 lb

## 2020-01-10 DIAGNOSIS — D0512 Intraductal carcinoma in situ of left breast: Secondary | ICD-10-CM | POA: Diagnosis not present

## 2020-01-10 DIAGNOSIS — Z170 Estrogen receptor positive status [ER+]: Secondary | ICD-10-CM | POA: Diagnosis not present

## 2020-01-10 DIAGNOSIS — I10 Essential (primary) hypertension: Secondary | ICD-10-CM | POA: Insufficient documentation

## 2020-01-10 DIAGNOSIS — Z79899 Other long term (current) drug therapy: Secondary | ICD-10-CM | POA: Diagnosis not present

## 2020-01-10 DIAGNOSIS — G20 Parkinson's disease: Secondary | ICD-10-CM | POA: Insufficient documentation

## 2020-01-10 DIAGNOSIS — Z85828 Personal history of other malignant neoplasm of skin: Secondary | ICD-10-CM | POA: Insufficient documentation

## 2020-01-10 DIAGNOSIS — M5124 Other intervertebral disc displacement, thoracic region: Secondary | ICD-10-CM | POA: Insufficient documentation

## 2020-01-10 DIAGNOSIS — Z7982 Long term (current) use of aspirin: Secondary | ICD-10-CM | POA: Insufficient documentation

## 2020-01-10 DIAGNOSIS — Z7189 Other specified counseling: Secondary | ICD-10-CM | POA: Diagnosis not present

## 2020-01-10 DIAGNOSIS — C50412 Malignant neoplasm of upper-outer quadrant of left female breast: Secondary | ICD-10-CM

## 2020-01-10 NOTE — Progress Notes (Signed)
Pt here to get pathology report and plan for treatment. Already met with dr Baruch Gouty and he is going to start radiation.

## 2020-01-10 NOTE — Consult Note (Signed)
NEW PATIENT EVALUATION  Name: Sheila Mitchell  MRN: [id number]  Date:   01/10/2020     DOB: 1940-05-23   This 79 y.o. 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  REFERRING PHYSICIAN: Danelle Berry, NP  CHIEF COMPLAINT: No chief complaint on file.   DIAGNOSIS: There were no encounter diagnoses.   PREVIOUS INVESTIGATIONS:  Mammograms and ultrasound reviewed Clinical notes reviewed Pathology report reviewed  HPI: Patient is a 79 year old female who presented with an abnormal mammogram of her left breast.  There was an indeterminate left breast mass at the 3 o'clock position corresponding to screening mammogram findings with recommendation for ultrasound-guided biopsy.  There was no suspicious left axillary lymphadenopathy.  Biopsy was positive for ductal carcinoma in situ.  She went on to have a wide local excision for a 1.1 cm area of ductal carcinoma in situ nuclear grade 2 with margins clear at least 4 mm.  Tumor was ER positive.  No sentinel regional lymph nodes were submitted for review.  She is done well postoperatively.  She is seen today for radiation college opinion.  She specifically denies breast tenderness cough or bone pain.  PLANNED TREATMENT REGIMEN: Hypofractionated left whole breast radiation  PAST MEDICAL HISTORY:  has a past medical history of Basal cell carcinoma, Breast cancer ([place]), Heart disease, Hypertension, Leaky heart valve, Parkinson disease ([place]), Ruptured disc, thoracic, and Tremor.    PAST SURGICAL HISTORY:  Past Surgical History:  Procedure Laterality Date  . ABDOMINAL HYSTERECTOMY     complete  . APPENDECTOMY     at the same time of hysterectomy  . BASAL CELL CARCINOMA EXCISION     nose  . BREAST LUMPECTOMY Left 12/29/2019   Procedure: BREAST LUMPECTOMY;  Surgeon: Robert Bellow, MD;  Location: [facility] ORS;  Service: General;   Laterality: Left;  . CHOLECYSTECTOMY    . EYE SURGERY  2021  . TONSILLECTOMY      FAMILY HISTORY: family history includes COPD in her mother; Diabetes in her paternal grandmother; Heart disease in her father.  SOCIAL HISTORY:  reports that she has never smoked. She has never used smokeless tobacco. She reports previous alcohol use. She reports that she does not use drugs.  ALLERGIES: Patient has no known allergies.  MEDICATIONS:  Current Outpatient Medications  Medication Sig Dispense Refill  . aspirin EC 81 MG tablet Take 81 mg by mouth daily.    . Calcium Carb-Cholecalciferol (CALCIUM + D3 PO) Take 1 tablet by mouth daily.     . carbidopa-levodopa (SINEMET IR) 25-100 MG tablet Take 1 tablet by mouth 3 (three) times daily. 90 tablet 11  . cholecalciferol (VITAMIN D3) 25 MCG (1000 UNIT) tablet Take 1,000 Units by mouth daily.    Marland Kitchen HYDROcodone-acetaminophen (NORCO/VICODIN) 5-325 MG tablet Take 1 tablet by mouth every 4 (four) hours as needed for moderate pain. 10 tablet 0  . lisinopril (PRINIVIL,ZESTRIL) 5 MG tablet Take 5 mg by mouth daily.    . Multiple Vitamin (MULTIVITAMIN) tablet Take 1 tablet by mouth daily.    Marland Kitchen olopatadine (PATADAY) 0.1 % ophthalmic solution Place 1 drop into both eyes 2 (two) times daily.    . Omega 3 1200 MG CAPS Take 1,200 mg by mouth daily.    . simvastatin (ZOCOR) 20 MG tablet Take 20 mg by mouth at bedtime.     . torsemide (DEMADEX) 20  MG tablet Take 20 mg by mouth daily.    . Coenzyme Q10 (CO Q 10 PO) Take 200 mg by mouth daily.      No current facility-administered medications for this encounter.    ECOG PERFORMANCE STATUS:  0 - Asymptomatic  REVIEW OF SYSTEMS: Patient denies any weight loss, fatigue, weakness, fever, chills or night sweats. Patient denies any loss of vision, blurred vision. Patient denies any ringing  of the ears or hearing loss. No irregular heartbeat. Patient denies heart murmur or history of fainting. Patient denies any chest pain  or pain radiating to her upper extremities. Patient denies any shortness of breath, difficulty breathing at night, cough or hemoptysis. Patient denies any swelling in the lower legs. Patient denies any nausea vomiting, vomiting of blood, or coffee ground material in the vomitus. Patient denies any stomach pain. Patient states has had normal bowel movements no significant constipation or diarrhea. Patient denies any dysuria, hematuria or significant nocturia. Patient denies any problems walking, swelling in the joints or loss of balance. Patient denies any skin changes, loss of hair or loss of weight. Patient denies any excessive worrying or anxiety or significant depression. Patient denies any problems with insomnia. Patient denies excessive thirst, polyuria, polydipsia. Patient denies any swollen glands, patient denies easy bruising or easy bleeding. Patient denies any recent infections, allergies or URI. Patient "s visual fields have not changed significantly in recent time.   PHYSICAL EXAM: BP (!) 119/57   Pulse 65   Temp (!) 97.2 F (36.2 C) (Tympanic)   Wt 171 lb (77.6 kg)   BMI 31.28 kg/m  Left breast is a wide local excision scar which is healing well.  No dominant mass or nodularity is noted in either breast in 2 positions examined.  No axillary or supraclavicular adenopathy is noted.  Well-developed well-nourished patient in NAD. HEENT reveals PERLA, EOMI, discs not visualized.  Oral cavity is clear. No oral mucosal lesions are identified. Neck is clear without evidence of cervical or supraclavicular adenopathy. Lungs are clear to A&P. Cardiac examination is essentially unremarkable with regular rate and rhythm without murmur rub or thrill. Abdomen is benign with no organomegaly or masses noted. Motor sensory and DTR levels are equal and symmetric in the upper and lower extremities. Cranial nerves II through XII are grossly intact. Proprioception is intact. No peripheral adenopathy or edema is  identified. No motor or sensory levels are noted. Crude visual fields are within normal range.  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  Would also boost her scar another 1000 cGy with electron beam.  Risks and benefits of treatment including skin reaction fatigue alteration of blood counts possible inclusion of superficial lung all were described in detail to the patient.  I have personally set up and ordered CT simulation for later this week.  She comprehends my treatment plan well.  Patient also will benefit from antiestrogen therapy after completion of radiation.  I would like to take this opportunity to thank you for allowing me to participate in the care of your patient.Noreene Filbert, MD

## 2020-01-11 ENCOUNTER — Ambulatory Visit: Payer: Medicare HMO | Admitting: Neurology

## 2020-01-12 ENCOUNTER — Telehealth: Payer: Self-pay | Admitting: *Deleted

## 2020-01-12 DIAGNOSIS — D0512 Intraductal carcinoma in situ of left breast: Secondary | ICD-10-CM | POA: Insufficient documentation

## 2020-01-12 MED ORDER — ANASTROZOLE 1 MG PO TABS: 1.0000 mg | ORAL_TABLET | Freq: Every day | ORAL | 3 refills | Status: DC

## 2020-01-12 NOTE — Progress Notes (Signed)
Hematology/Oncology Consult note [facility]  Telephone:([phone] [phone]  Patient Care Team: Danelle Berry, NP as PCP - General (Nurse Practitioner) Theodore Demark, RN as Oncology Nurse Navigator (Oncology)   Name of the patient: Sheila Mitchell  [id number]  Oct 07, 1940   Date of visit: 01/12/20  Diagnosis-left breast DCIS  Chief complaint/ Reason for visit-discuss final pathology results and further management  Heme/Onc history: - Patient is a 79 year old female referred for new diagnosis of DCIS.  She has not had any prior abnormal breast biopsy.  She she is G0 P0 L0.  Most recent mammogram in September 2021 showed an indeterminate left breast mass in the 3 o'clock position.  No left axillary adenopathy.  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   Final pathology showed 11 mm grade 2 DCIS with negative margins within a fibroadenoma.  pTis P NX ER greater than 90% positive  Interval history-patient is recovering well from her lumpectomy and she denies any complaints at this time.  She will be starting adjuvant radiation therapy soon.  ECOG PS- 1 Pain scale- 0   Review of systems- Review of Systems  Constitutional: Negative for chills, fever, malaise/fatigue and weight loss.  HENT: Negative for congestion, ear discharge and nosebleeds.   Eyes: Negative for blurred vision.  Respiratory: Negative for cough, hemoptysis, sputum production, shortness of breath and wheezing.   Cardiovascular: Negative for chest pain, palpitations, orthopnea and claudication.  Gastrointestinal: Negative for abdominal pain, blood in stool, constipation, diarrhea, heartburn, melena, nausea and vomiting.  Genitourinary: Negative for dysuria, flank pain, frequency, hematuria and urgency.  Musculoskeletal: Negative for back pain, joint pain and myalgias.  Skin: Negative for rash.  Neurological: Negative for  dizziness, tingling, focal weakness, seizures, weakness and headaches.  Endo/Heme/Allergies: Does not bruise/bleed easily.  Psychiatric/Behavioral: Negative for depression and suicidal ideas. The patient does not have insomnia.       No Known Allergies   Past Medical History:  Diagnosis Date  . Basal cell carcinoma    basal cell on left nose  . Breast cancer ([place])   . Heart disease   . Hypertension   . Leaky heart valve   . Parkinson disease ([place])   . Ruptured disc, thoracic   . Tremor      Past Surgical History:  Procedure Laterality Date  . ABDOMINAL HYSTERECTOMY     complete  . APPENDECTOMY     at the same time of hysterectomy  . BASAL CELL CARCINOMA EXCISION     nose  . BREAST LUMPECTOMY Left 12/29/2019   Procedure: BREAST LUMPECTOMY;  Surgeon: Robert Bellow, MD;  Location: [facility] ORS;  Service: General;  Laterality: Left;  . CHOLECYSTECTOMY    . EYE SURGERY  2021  . TONSILLECTOMY      Social History   Socioeconomic History  . Marital status: Widowed    Spouse name: Not on file  . Number of children: 3  . Years of education: one year college  . Highest education level: Not on file  Occupational History  . Occupation: Retired  Tobacco Use  . Smoking status: Never Smoker  . Smokeless tobacco: Never Used  Vaping Use  . Vaping Use: Never used  Substance and Sexual Activity  . Alcohol use: Not Currently  . Drug use: Never  . Sexual activity: Not Currently  Other Topics Concern  . Not on file  Social History Narrative   Lives at home alone.  Right-handed.   2 cups caffeine daily.   Social Determinants of Health   Financial Resource Strain:   . Difficulty of Paying Living Expenses: Not on file  Food Insecurity:   . Worried About Charity fundraiser in the Last Year: Not on file  . Ran Out of Food in the Last Year: Not on file  Transportation Needs:   . Lack of Transportation (Medical): Not on file  . Lack of Transportation (Non-Medical): Not  on file  Physical Activity:   . Days of Exercise per Week: Not on file  . Minutes of Exercise per Session: Not on file  Stress:   . Feeling of Stress : Not on file  Social Connections:   . Frequency of Communication with Friends and Family: Not on file  . Frequency of Social Gatherings with Friends and Family: Not on file  . Attends Religious Services: Not on file  . Active Member of Clubs or Organizations: Not on file  . Attends Archivist Meetings: Not on file  . Marital Status: Not on file  Intimate Partner Violence:   . Fear of Current or Ex-Partner: Not on file  . Emotionally Abused: Not on file  . Physically Abused: Not on file  . Sexually Abused: Not on file    Family History  Problem Relation Age of Onset  . COPD Mother   . Heart disease Father   . Diabetes Paternal Grandmother   . Breast cancer Neg Hx      Current Outpatient Medications:  .  aspirin EC 81 MG tablet, Take 81 mg by mouth daily., Disp: , Rfl:  .  Calcium Carb-Cholecalciferol (CALCIUM + D3 PO), Take 1 tablet by mouth daily. , Disp: , Rfl:  .  carbidopa-levodopa (SINEMET IR) 25-100 MG tablet, Take 1 tablet by mouth 3 (three) times daily., Disp: 90 tablet, Rfl: 11 .  cholecalciferol (VITAMIN D3) 25 MCG (1000 UNIT) tablet, Take 1,000 Units by mouth daily., Disp: , Rfl:  .  Coenzyme Q10 (CO Q 10 PO), Take 200 mg by mouth daily. , Disp: , Rfl:  .  HYDROcodone-acetaminophen (NORCO/VICODIN) 5-325 MG tablet, Take 1 tablet by mouth every 4 (four) hours as needed for moderate pain., Disp: 10 tablet, Rfl: 0 .  lisinopril (PRINIVIL,ZESTRIL) 5 MG tablet, Take 5 mg by mouth daily., Disp: , Rfl:  .  Multiple Vitamin (MULTIVITAMIN) tablet, Take 1 tablet by mouth daily., Disp: , Rfl:  .  olopatadine (PATADAY) 0.1 % ophthalmic solution, Place 1 drop into both eyes 2 (two) times daily., Disp: , Rfl:  .  Omega 3 1200 MG CAPS, Take 1,200 mg by mouth daily., Disp: , Rfl:  .  simvastatin (ZOCOR) 20 MG tablet, Take 20  mg by mouth at bedtime. , Disp: , Rfl:  .  torsemide (DEMADEX) 20 MG tablet, Take 20 mg by mouth daily., Disp: , Rfl:   Physical exam:  Vitals:   01/10/20 1115  BP: 115/68  Pulse: 62  Resp: 16  Temp: 98.1 F (36.7 C)  TempSrc: Tympanic  Weight: 171 lb 14.4 oz (78 kg)  Height: 5\' 2"  (1.575 m)   Physical Exam Constitutional:      General: She is not in acute distress. Cardiovascular:     Rate and Rhythm: Normal rate and regular rhythm.     Heart sounds: Normal heart sounds.  Pulmonary:     Effort: Pulmonary effort is normal.     Breath sounds: Normal breath sounds.  Abdominal:  General: Bowel sounds are normal.     Palpations: Abdomen is soft.  Skin:    General: Skin is warm and dry.  Neurological:     Mental Status: She is alert and oriented to person, place, and time.     Breast exam: Patient is s/p left lumpectomy with a well-healed surgical scar.  Steri-Strips are still in place.  No evidence of infection  MM Breast Surgical Specimen  Result Date: 12/29/2019 CLINICAL DATA:  Status post left breast lumpectomy. EXAM: SPECIMEN RADIOGRAPH OF THE left BREAST COMPARISON:  Previous exam(s). FINDINGS: Status post excision of the left breast. The venous shaped clip and grouped calcifications are present within the specimen. IMPRESSION: Specimen radiograph of the left breast. Electronically Signed   By: Kristopher Oppenheim M.D.   On: 12/29/2019 14:02     Assessment and plan- Patient is a 79 y.o. female with left breast DCIS ER positive s/p lumpectomy here to discuss final pathology results and further management  Final pathology showed 11 mm grade 2 DCIS that was ER positive with negative margins.  She will now be proceeding with adjuvant radiation therapy  Given that her tumor was ER PR positive hormone therapy is indicated at this time.  Idiscussed the role for hormone therapy. Given that she is postmenopausal I would favor 5 years of adjuvant hormone therapy with aromatase  inhibitor. I discussed the risks and benefits of Arimidex including all but not limited to fatigue, hypercholesterolemia, hot flashes, arthralgias and worsening bone health.  Patient will also need to be on calcium 1200 mg along with vitamin D 800 international units.  We will obtain a baseline bone density scan written information about Arimidex given to the patient. I would like her to finish radiation therapy and start hormone therapy thereafter. Patient verbalized understanding and agrees to proceed.  Treatment will be given with a curative intent  Patient states that she has had a prior bone density scan with Dr. Sharol Roussel office and we will try to get those reports for our comparison and we will obtain a repeat bone density scan at this time. If she has any evidence of significant osteopenia or osteoporosis consideration may have to be given to switch her to tamoxifen.  She is already undergone cataract surgeries and hysterectomy but there would be risk of DVT associated with tamoxifen.  Patient verbalized understanding  I will see her back in 3-1/2 months with CBC with differential and CMP.      Visit Diagnosis 1. Ductal carcinoma in situ (DCIS) of left breast   2. Goals of care, counseling/discussion      Dr. Randa Evens, MD, MPH [facility] [phone] 01/12/2020 1:29 PM

## 2020-01-12 NOTE — Telephone Encounter (Signed)
Left message at office requesting to get bone density scan at their office. Left fax number and direct number to rao team

## 2020-01-13 ENCOUNTER — Other Ambulatory Visit: Payer: Self-pay | Admitting: *Deleted

## 2020-01-13 ENCOUNTER — Ambulatory Visit
Admission: RE | Admit: 2020-01-13 | Discharge: 2020-01-13 | Disposition: A | Discharge status: Home / Self Care | Payer: Medicare HMO | Source: Ambulatory Visit | Attending: Radiation Oncology | Admitting: Radiation Oncology

## 2020-01-13 DIAGNOSIS — Z170 Estrogen receptor positive status [ER+]: Secondary | ICD-10-CM | POA: Insufficient documentation

## 2020-01-13 DIAGNOSIS — D0512 Intraductal carcinoma in situ of left breast: Secondary | ICD-10-CM | POA: Insufficient documentation

## 2020-01-13 DIAGNOSIS — Z510 Encounter for antineoplastic radiation therapy: Secondary | ICD-10-CM | POA: Insufficient documentation

## 2020-01-13 DIAGNOSIS — C50412 Malignant neoplasm of upper-outer quadrant of left female breast: Secondary | ICD-10-CM

## 2020-01-17 DIAGNOSIS — Z170 Estrogen receptor positive status [ER+]: Secondary | ICD-10-CM | POA: Insufficient documentation

## 2020-01-17 DIAGNOSIS — D0512 Intraductal carcinoma in situ of left breast: Secondary | ICD-10-CM | POA: Diagnosis not present

## 2020-01-17 DIAGNOSIS — Z510 Encounter for antineoplastic radiation therapy: Secondary | ICD-10-CM | POA: Insufficient documentation

## 2020-01-19 IMAGING — MG MM DIGITAL SCREENING BILAT W/ TOMO W/ CAD
6 of 10 series · 6 of 30 positions shown · non-contrast
Comparison: Previous exam(s).

CLINICAL DATA: Screening.

EXAM:
DIGITAL SCREENING BILATERAL MAMMOGRAM WITH TOMO AND CAD

[R CC synth-2D]
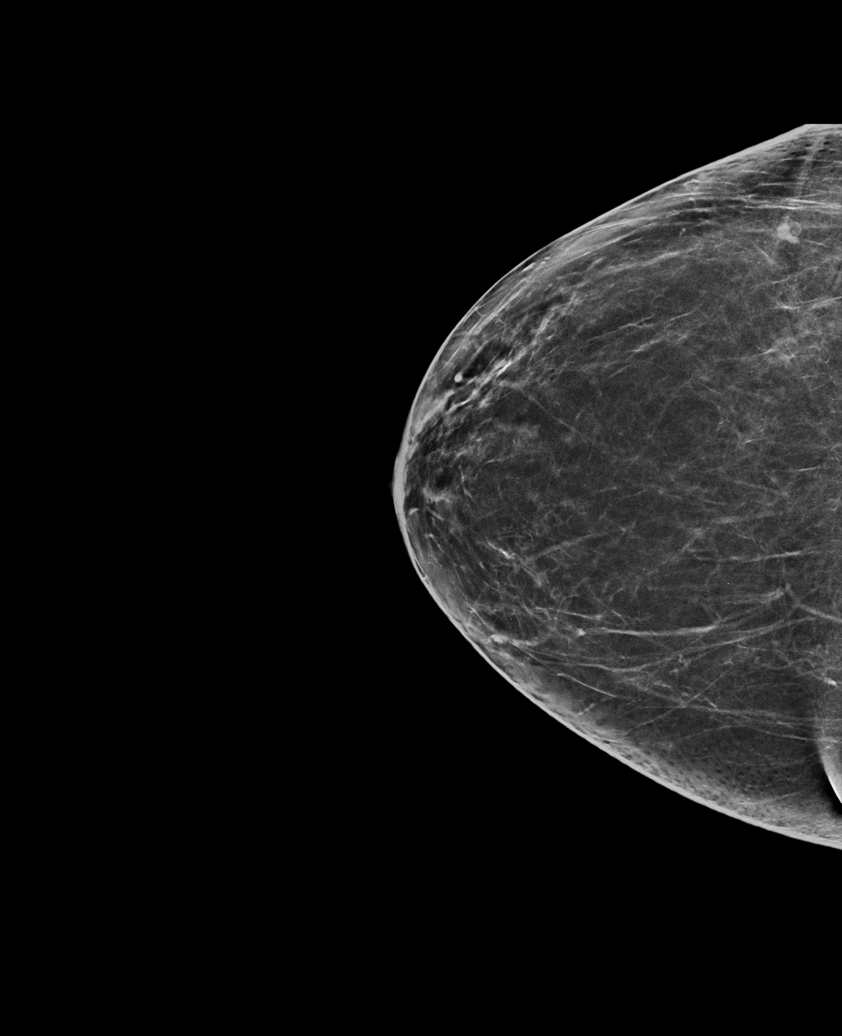

[L CC synth-2D]
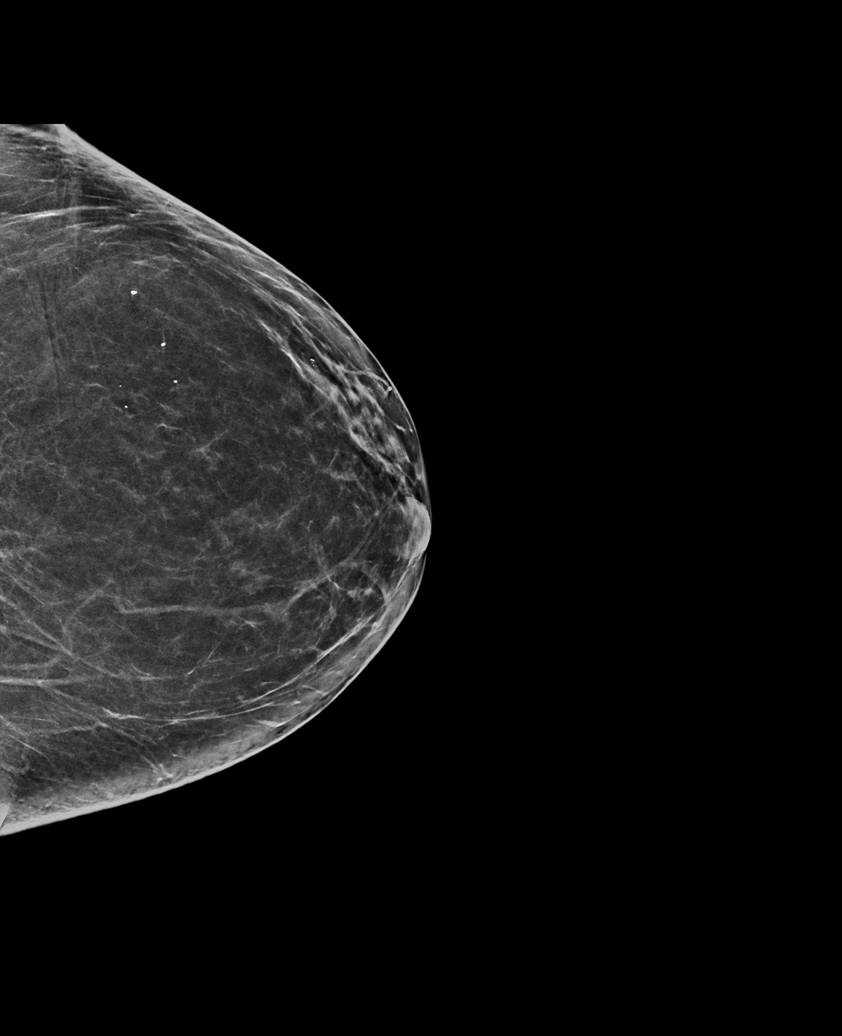

[L MLO synth-2D (1 of 2)]
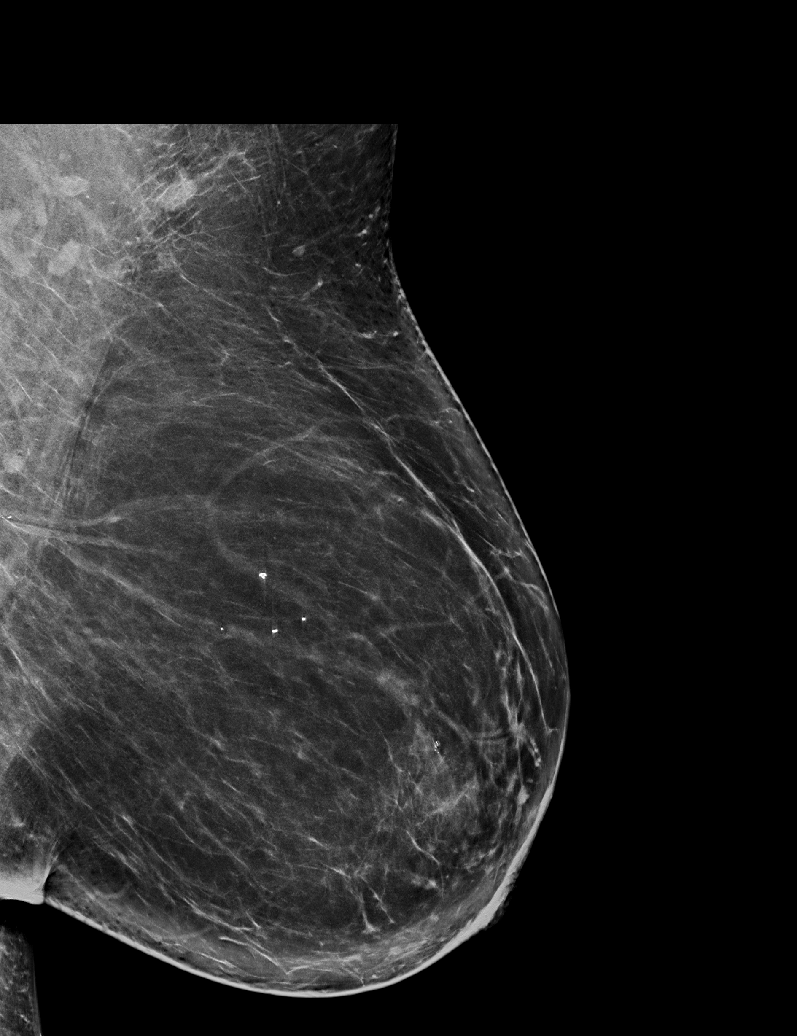

[R MLO synth-2D]
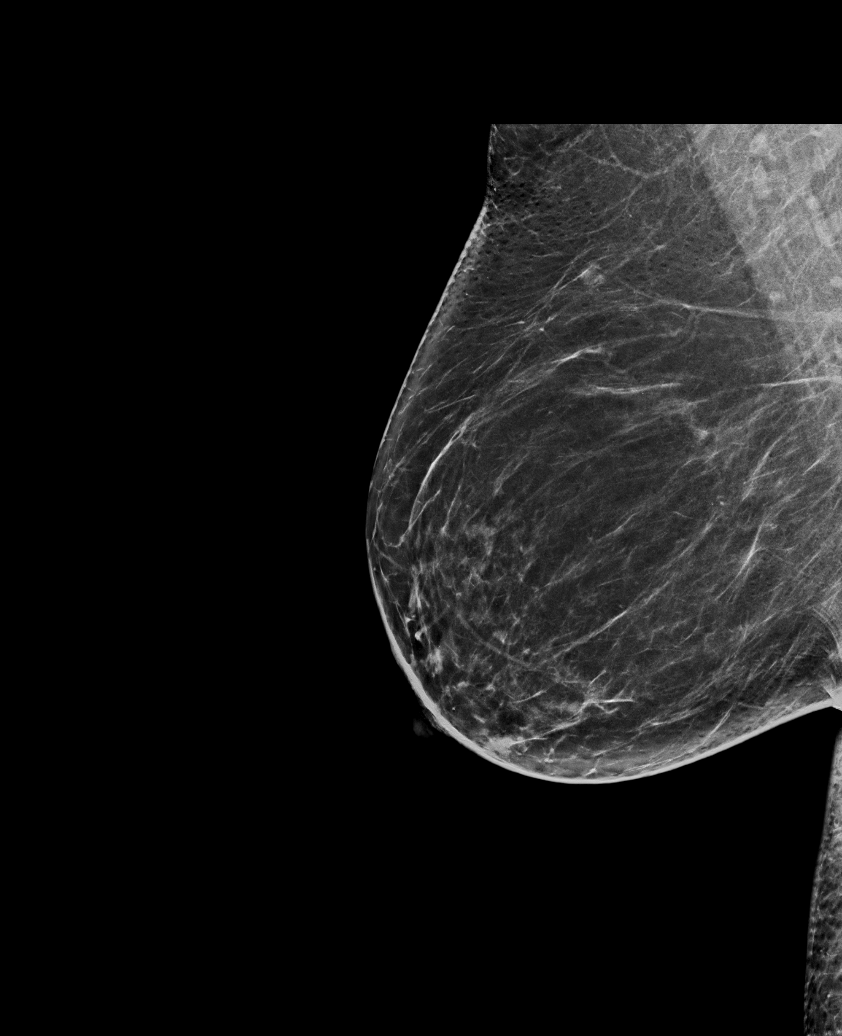

[L MLO synth-2D (2 of 2)]
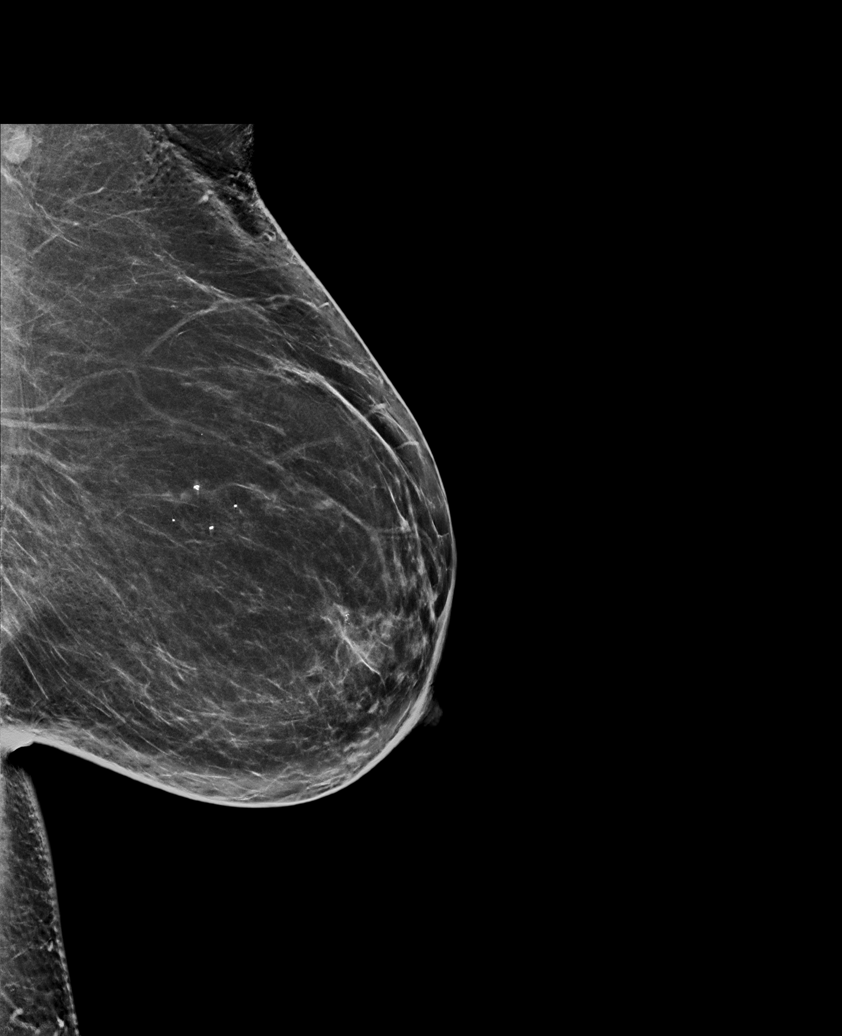

[R MLO tomo · tomo slice 38/75.0]
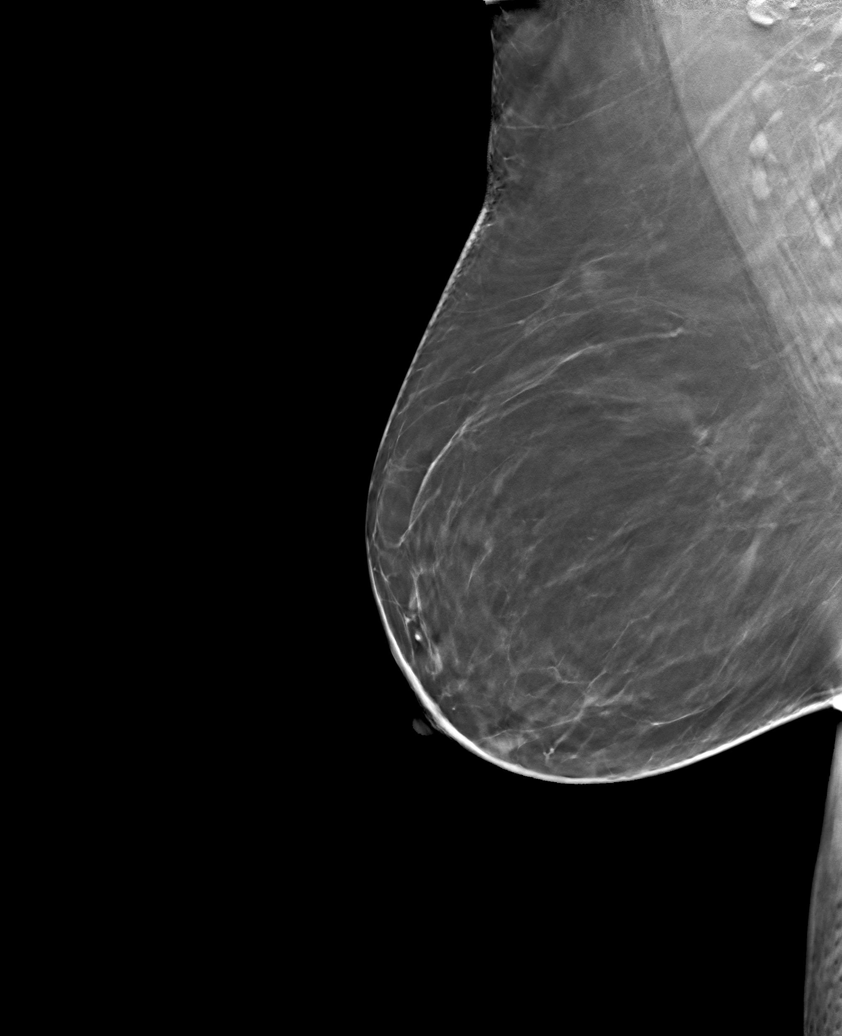

[6 of 30 positions shown; findings below may reference images not displayed]

ACR Breast Density Category b: There are scattered areas of
fibroglandular density.
FINDINGS: There are no findings suspicious for malignancy. Images were
processed with CAD.
IMPRESSION: No mammographic evidence of malignancy. A result letter of this
screening mammogram will be mailed directly to the patient.

RECOMMENDATION:
Screening mammogram in one year. (Code:[id number])

BI-RADS CATEGORY  1: Negative.

## 2020-01-20 ENCOUNTER — Ambulatory Visit: Admission: RE | Admit: 2020-01-20 | Payer: Medicare HMO | Source: Ambulatory Visit

## 2020-01-20 DIAGNOSIS — D0512 Intraductal carcinoma in situ of left breast: Secondary | ICD-10-CM | POA: Diagnosis not present

## 2020-01-20 DIAGNOSIS — Z510 Encounter for antineoplastic radiation therapy: Secondary | ICD-10-CM | POA: Diagnosis not present

## 2020-01-20 DIAGNOSIS — Z170 Estrogen receptor positive status [ER+]: Secondary | ICD-10-CM | POA: Diagnosis not present

## 2020-01-24 ENCOUNTER — Ambulatory Visit
Admission: RE | Admit: 2020-01-24 | Discharge: 2020-01-24 | Disposition: A | Discharge status: Home / Self Care | Payer: Medicare HMO | Source: Ambulatory Visit | Attending: Radiation Oncology | Admitting: Radiation Oncology

## 2020-01-24 DIAGNOSIS — Z510 Encounter for antineoplastic radiation therapy: Secondary | ICD-10-CM | POA: Diagnosis not present

## 2020-01-24 DIAGNOSIS — D0512 Intraductal carcinoma in situ of left breast: Secondary | ICD-10-CM | POA: Diagnosis not present

## 2020-01-24 DIAGNOSIS — Z170 Estrogen receptor positive status [ER+]: Secondary | ICD-10-CM | POA: Diagnosis not present

## 2020-01-25 ENCOUNTER — Ambulatory Visit
Admission: RE | Admit: 2020-01-25 | Discharge: 2020-01-25 | Disposition: A | Discharge status: Home / Self Care | Payer: Medicare HMO | Source: Ambulatory Visit | Attending: Radiation Oncology | Admitting: Radiation Oncology

## 2020-01-25 DIAGNOSIS — D0512 Intraductal carcinoma in situ of left breast: Secondary | ICD-10-CM | POA: Diagnosis not present

## 2020-01-25 DIAGNOSIS — Z170 Estrogen receptor positive status [ER+]: Secondary | ICD-10-CM | POA: Diagnosis not present

## 2020-01-25 DIAGNOSIS — Z510 Encounter for antineoplastic radiation therapy: Secondary | ICD-10-CM | POA: Diagnosis not present

## 2020-01-26 ENCOUNTER — Ambulatory Visit
Admission: RE | Admit: 2020-01-26 | Discharge: 2020-01-26 | Disposition: A | Discharge status: Home / Self Care | Payer: Medicare HMO | Source: Ambulatory Visit | Attending: Radiation Oncology | Admitting: Radiation Oncology

## 2020-01-26 DIAGNOSIS — Z510 Encounter for antineoplastic radiation therapy: Secondary | ICD-10-CM | POA: Diagnosis not present

## 2020-01-26 DIAGNOSIS — D0512 Intraductal carcinoma in situ of left breast: Secondary | ICD-10-CM | POA: Diagnosis not present

## 2020-01-26 DIAGNOSIS — Z170 Estrogen receptor positive status [ER+]: Secondary | ICD-10-CM | POA: Diagnosis not present

## 2020-01-27 ENCOUNTER — Ambulatory Visit
Admission: RE | Admit: 2020-01-27 | Discharge: 2020-01-27 | Disposition: A | Discharge status: Home / Self Care | Payer: Medicare HMO | Source: Ambulatory Visit | Attending: Radiation Oncology | Admitting: Radiation Oncology

## 2020-01-27 DIAGNOSIS — Z170 Estrogen receptor positive status [ER+]: Secondary | ICD-10-CM | POA: Diagnosis not present

## 2020-01-27 DIAGNOSIS — Z510 Encounter for antineoplastic radiation therapy: Secondary | ICD-10-CM | POA: Diagnosis not present

## 2020-01-27 DIAGNOSIS — D0512 Intraductal carcinoma in situ of left breast: Secondary | ICD-10-CM | POA: Diagnosis not present

## 2020-01-28 ENCOUNTER — Ambulatory Visit
Admission: RE | Admit: 2020-01-28 | Discharge: 2020-01-28 | Disposition: A | Discharge status: Home / Self Care | Payer: Medicare HMO | Source: Ambulatory Visit | Attending: Radiation Oncology | Admitting: Radiation Oncology

## 2020-01-28 DIAGNOSIS — Z170 Estrogen receptor positive status [ER+]: Secondary | ICD-10-CM | POA: Diagnosis not present

## 2020-01-28 DIAGNOSIS — D0512 Intraductal carcinoma in situ of left breast: Secondary | ICD-10-CM | POA: Diagnosis not present

## 2020-01-28 DIAGNOSIS — Z510 Encounter for antineoplastic radiation therapy: Secondary | ICD-10-CM | POA: Diagnosis not present

## 2020-01-31 ENCOUNTER — Ambulatory Visit
Admission: RE | Admit: 2020-01-31 | Discharge: 2020-01-31 | Disposition: A | Discharge status: Home / Self Care | Payer: Medicare HMO | Source: Ambulatory Visit | Attending: Radiation Oncology | Admitting: Radiation Oncology

## 2020-01-31 DIAGNOSIS — D0512 Intraductal carcinoma in situ of left breast: Secondary | ICD-10-CM | POA: Diagnosis not present

## 2020-01-31 DIAGNOSIS — Z510 Encounter for antineoplastic radiation therapy: Secondary | ICD-10-CM | POA: Diagnosis not present

## 2020-01-31 DIAGNOSIS — Z170 Estrogen receptor positive status [ER+]: Secondary | ICD-10-CM | POA: Diagnosis not present

## 2020-02-01 ENCOUNTER — Ambulatory Visit
Admission: RE | Admit: 2020-02-01 | Discharge: 2020-02-01 | Disposition: A | Discharge status: Home / Self Care | Payer: Medicare HMO | Source: Ambulatory Visit | Attending: Radiation Oncology | Admitting: Radiation Oncology

## 2020-02-01 DIAGNOSIS — D0512 Intraductal carcinoma in situ of left breast: Secondary | ICD-10-CM | POA: Diagnosis not present

## 2020-02-01 DIAGNOSIS — Z170 Estrogen receptor positive status [ER+]: Secondary | ICD-10-CM | POA: Diagnosis not present

## 2020-02-01 DIAGNOSIS — Z510 Encounter for antineoplastic radiation therapy: Secondary | ICD-10-CM | POA: Diagnosis not present

## 2020-02-02 ENCOUNTER — Other Ambulatory Visit: Payer: Self-pay | Admitting: *Deleted

## 2020-02-02 ENCOUNTER — Ambulatory Visit
Admission: RE | Admit: 2020-02-02 | Discharge: 2020-02-02 | Disposition: A | Discharge status: Home / Self Care | Payer: Medicare HMO | Source: Ambulatory Visit | Attending: Radiation Oncology | Admitting: Radiation Oncology

## 2020-02-02 DIAGNOSIS — D0512 Intraductal carcinoma in situ of left breast: Secondary | ICD-10-CM | POA: Diagnosis not present

## 2020-02-02 DIAGNOSIS — Z510 Encounter for antineoplastic radiation therapy: Secondary | ICD-10-CM | POA: Diagnosis not present

## 2020-02-02 DIAGNOSIS — Z170 Estrogen receptor positive status [ER+]: Secondary | ICD-10-CM | POA: Diagnosis not present

## 2020-02-03 ENCOUNTER — Ambulatory Visit
Admission: RE | Admit: 2020-02-03 | Discharge: 2020-02-03 | Disposition: A | Discharge status: Home / Self Care | Payer: Medicare HMO | Source: Ambulatory Visit | Attending: Radiation Oncology | Admitting: Radiation Oncology

## 2020-02-03 DIAGNOSIS — Z510 Encounter for antineoplastic radiation therapy: Secondary | ICD-10-CM | POA: Diagnosis not present

## 2020-02-03 DIAGNOSIS — D0512 Intraductal carcinoma in situ of left breast: Secondary | ICD-10-CM | POA: Diagnosis not present

## 2020-02-03 DIAGNOSIS — Z170 Estrogen receptor positive status [ER+]: Secondary | ICD-10-CM | POA: Diagnosis not present

## 2020-02-04 ENCOUNTER — Telehealth: Payer: Self-pay | Admitting: Oncology

## 2020-02-04 ENCOUNTER — Ambulatory Visit
Admission: RE | Admit: 2020-02-04 | Discharge: 2020-02-04 | Disposition: A | Discharge status: Home / Self Care | Payer: Medicare HMO | Source: Ambulatory Visit | Attending: Radiation Oncology | Admitting: Radiation Oncology

## 2020-02-04 DIAGNOSIS — D0512 Intraductal carcinoma in situ of left breast: Secondary | ICD-10-CM | POA: Diagnosis not present

## 2020-02-04 DIAGNOSIS — Z170 Estrogen receptor positive status [ER+]: Secondary | ICD-10-CM | POA: Diagnosis not present

## 2020-02-04 DIAGNOSIS — Z510 Encounter for antineoplastic radiation therapy: Secondary | ICD-10-CM | POA: Diagnosis not present

## 2020-02-04 NOTE — Telephone Encounter (Signed)
I left a VM for patient with appt detail for her Bone Density scheduled for 02/09/20 @ 220 pm. I ask the patient to return my call with confirmation.

## 2020-02-07 ENCOUNTER — Ambulatory Visit
Admission: RE | Admit: 2020-02-07 | Discharge: 2020-02-07 | Disposition: A | Discharge status: Home / Self Care | Payer: Medicare HMO | Source: Ambulatory Visit | Attending: Radiation Oncology | Admitting: Radiation Oncology

## 2020-02-07 DIAGNOSIS — D0512 Intraductal carcinoma in situ of left breast: Secondary | ICD-10-CM | POA: Diagnosis not present

## 2020-02-07 DIAGNOSIS — Z170 Estrogen receptor positive status [ER+]: Secondary | ICD-10-CM | POA: Diagnosis not present

## 2020-02-07 DIAGNOSIS — Z510 Encounter for antineoplastic radiation therapy: Secondary | ICD-10-CM | POA: Diagnosis not present

## 2020-02-08 ENCOUNTER — Ambulatory Visit
Admission: RE | Admit: 2020-02-08 | Discharge: 2020-02-08 | Disposition: A | Discharge status: Home / Self Care | Payer: Medicare HMO | Source: Ambulatory Visit | Attending: Radiation Oncology | Admitting: Radiation Oncology

## 2020-02-08 DIAGNOSIS — Z510 Encounter for antineoplastic radiation therapy: Secondary | ICD-10-CM | POA: Diagnosis not present

## 2020-02-08 DIAGNOSIS — Z170 Estrogen receptor positive status [ER+]: Secondary | ICD-10-CM | POA: Diagnosis not present

## 2020-02-08 DIAGNOSIS — D0512 Intraductal carcinoma in situ of left breast: Secondary | ICD-10-CM | POA: Diagnosis not present

## 2020-02-09 ENCOUNTER — Inpatient Hospital Stay: Payer: Medicare HMO | Attending: Oncology

## 2020-02-09 ENCOUNTER — Ambulatory Visit
Admission: RE | Admit: 2020-02-09 | Discharge: 2020-02-09 | Disposition: A | Discharge status: Home / Self Care | Payer: Medicare HMO | Source: Ambulatory Visit | Attending: Radiation Oncology | Admitting: Radiation Oncology

## 2020-02-09 ENCOUNTER — Ambulatory Visit
Admission: RE | Admit: 2020-02-09 | Discharge: 2020-02-09 | Disposition: A | Discharge status: Home / Self Care | Payer: Medicare HMO | Source: Ambulatory Visit | Attending: Oncology | Admitting: Oncology

## 2020-02-09 ENCOUNTER — Other Ambulatory Visit: Payer: Self-pay

## 2020-02-09 DIAGNOSIS — Z923 Personal history of irradiation: Secondary | ICD-10-CM | POA: Diagnosis not present

## 2020-02-09 DIAGNOSIS — Z170 Estrogen receptor positive status [ER+]: Secondary | ICD-10-CM | POA: Diagnosis not present

## 2020-02-09 DIAGNOSIS — Z510 Encounter for antineoplastic radiation therapy: Secondary | ICD-10-CM | POA: Diagnosis not present

## 2020-02-09 DIAGNOSIS — M85851 Other specified disorders of bone density and structure, right thigh: Secondary | ICD-10-CM | POA: Insufficient documentation

## 2020-02-09 DIAGNOSIS — Z780 Asymptomatic menopausal state: Secondary | ICD-10-CM | POA: Diagnosis not present

## 2020-02-09 DIAGNOSIS — D0512 Intraductal carcinoma in situ of left breast: Secondary | ICD-10-CM

## 2020-02-09 DIAGNOSIS — Z853 Personal history of malignant neoplasm of breast: Secondary | ICD-10-CM | POA: Diagnosis not present

## 2020-02-09 DIAGNOSIS — Z13820 Encounter for screening for osteoporosis: Secondary | ICD-10-CM | POA: Insufficient documentation

## 2020-02-09 DIAGNOSIS — Z90722 Acquired absence of ovaries, bilateral: Secondary | ICD-10-CM | POA: Diagnosis not present

## 2020-02-09 DIAGNOSIS — C50412 Malignant neoplasm of upper-outer quadrant of left female breast: Secondary | ICD-10-CM

## 2020-02-09 LAB — CBC
HCT: 40.4 % (ref 36.0–46.0)
Hemoglobin: 13.3 g/dL (ref 12.0–15.0)
MCH: 29.8 pg (ref 26.0–34.0)
MCHC: 32.9 g/dL (ref 30.0–36.0)
MCV: 90.6 fL (ref 80.0–100.0)
Platelets: 228 10*3/uL (ref 150–400)
RBC: 4.46 MIL/uL (ref 3.87–5.11)
RDW: 13.6 % (ref 11.5–15.5)
WBC: 6.1 10*3/uL (ref 4.0–10.5)
nRBC: 0 % (ref 0.0–0.2)

## 2020-02-14 ENCOUNTER — Ambulatory Visit
Admission: RE | Admit: 2020-02-14 | Discharge: 2020-02-14 | Disposition: A | Discharge status: Home / Self Care | Payer: Medicare HMO | Source: Ambulatory Visit | Attending: Radiation Oncology | Admitting: Radiation Oncology

## 2020-02-14 DIAGNOSIS — D0512 Intraductal carcinoma in situ of left breast: Secondary | ICD-10-CM | POA: Diagnosis not present

## 2020-02-14 DIAGNOSIS — Z170 Estrogen receptor positive status [ER+]: Secondary | ICD-10-CM | POA: Diagnosis not present

## 2020-02-14 DIAGNOSIS — Z510 Encounter for antineoplastic radiation therapy: Secondary | ICD-10-CM | POA: Diagnosis not present

## 2020-02-15 ENCOUNTER — Ambulatory Visit
Admission: RE | Admit: 2020-02-15 | Discharge: 2020-02-15 | Disposition: A | Discharge status: Home / Self Care | Payer: Medicare HMO | Source: Ambulatory Visit | Attending: Radiation Oncology | Admitting: Radiation Oncology

## 2020-02-15 DIAGNOSIS — D0512 Intraductal carcinoma in situ of left breast: Secondary | ICD-10-CM | POA: Diagnosis not present

## 2020-02-15 DIAGNOSIS — Z510 Encounter for antineoplastic radiation therapy: Secondary | ICD-10-CM | POA: Diagnosis not present

## 2020-02-15 DIAGNOSIS — Z170 Estrogen receptor positive status [ER+]: Secondary | ICD-10-CM | POA: Diagnosis not present

## 2020-02-16 ENCOUNTER — Ambulatory Visit
Admission: RE | Admit: 2020-02-16 | Discharge: 2020-02-16 | Disposition: A | Discharge status: Home / Self Care | Payer: Medicare HMO | Source: Ambulatory Visit | Attending: Radiation Oncology | Admitting: Radiation Oncology

## 2020-02-16 DIAGNOSIS — D0512 Intraductal carcinoma in situ of left breast: Secondary | ICD-10-CM | POA: Insufficient documentation

## 2020-02-16 DIAGNOSIS — Z510 Encounter for antineoplastic radiation therapy: Secondary | ICD-10-CM | POA: Diagnosis present

## 2020-02-16 DIAGNOSIS — Z170 Estrogen receptor positive status [ER+]: Secondary | ICD-10-CM | POA: Insufficient documentation

## 2020-02-17 ENCOUNTER — Ambulatory Visit
Admission: RE | Admit: 2020-02-17 | Discharge: 2020-02-17 | Disposition: A | Discharge status: Home / Self Care | Payer: Medicare HMO | Source: Ambulatory Visit | Attending: Radiation Oncology | Admitting: Radiation Oncology

## 2020-02-17 DIAGNOSIS — Z510 Encounter for antineoplastic radiation therapy: Secondary | ICD-10-CM | POA: Diagnosis not present

## 2020-02-18 ENCOUNTER — Ambulatory Visit
Admission: RE | Admit: 2020-02-18 | Discharge: 2020-02-18 | Disposition: A | Discharge status: Home / Self Care | Payer: Medicare HMO | Source: Ambulatory Visit | Attending: Radiation Oncology | Admitting: Radiation Oncology

## 2020-02-18 DIAGNOSIS — Z510 Encounter for antineoplastic radiation therapy: Secondary | ICD-10-CM | POA: Diagnosis not present

## 2020-02-21 ENCOUNTER — Ambulatory Visit
Admission: RE | Admit: 2020-02-21 | Discharge: 2020-02-21 | Disposition: A | Discharge status: Home / Self Care | Payer: Medicare HMO | Source: Ambulatory Visit | Attending: Radiation Oncology | Admitting: Radiation Oncology

## 2020-02-21 DIAGNOSIS — Z510 Encounter for antineoplastic radiation therapy: Secondary | ICD-10-CM | POA: Diagnosis not present

## 2020-02-22 ENCOUNTER — Ambulatory Visit
Admission: RE | Admit: 2020-02-22 | Discharge: 2020-02-22 | Disposition: A | Discharge status: Home / Self Care | Payer: Medicare HMO | Source: Ambulatory Visit | Attending: Radiation Oncology | Admitting: Radiation Oncology

## 2020-02-22 DIAGNOSIS — D0512 Intraductal carcinoma in situ of left breast: Secondary | ICD-10-CM | POA: Diagnosis not present

## 2020-02-22 DIAGNOSIS — Z510 Encounter for antineoplastic radiation therapy: Secondary | ICD-10-CM | POA: Diagnosis not present

## 2020-02-22 DIAGNOSIS — Z170 Estrogen receptor positive status [ER+]: Secondary | ICD-10-CM | POA: Diagnosis not present

## 2020-02-23 ENCOUNTER — Ambulatory Visit
Admission: RE | Admit: 2020-02-23 | Discharge: 2020-02-23 | Disposition: A | Discharge status: Home / Self Care | Payer: Medicare HMO | Source: Ambulatory Visit | Attending: Radiation Oncology | Admitting: Radiation Oncology

## 2020-02-23 DIAGNOSIS — Z510 Encounter for antineoplastic radiation therapy: Secondary | ICD-10-CM | POA: Diagnosis not present

## 2020-03-02 ENCOUNTER — Encounter: Payer: Self-pay | Admitting: Neurology

## 2020-03-02 ENCOUNTER — Ambulatory Visit: Payer: Medicare HMO | Admitting: Neurology

## 2020-03-02 VITALS — BP 120/64 | HR 70 | Ht 62.0 in | Wt 173.4 lb

## 2020-03-02 DIAGNOSIS — G20 Parkinson's disease: Secondary | ICD-10-CM

## 2020-03-02 MED ORDER — CARBIDOPA-LEVODOPA 25-100 MG PO TABS: 1.0000 | ORAL_TABLET | Freq: Three times a day (TID) | ORAL | 11 refills | Status: DC

## 2020-03-02 NOTE — Patient Instructions (Signed)
May change Sinemet dosing interval to 9:30 am, 2:30 pm, 7:30 or 8 pm  See if better benefit  See you back in 6 months

## 2020-03-02 NOTE — Progress Notes (Signed)
PATIENT: Sheila Mitchell DOB: 10-28-1940  REASON FOR VISIT: follow up HISTORY FROM: patient  HISTORY OF PRESENT ILLNESS: Today 03/02/20  HISTORY  Dian Laprade Perezis a 79 years old female, seen in request byher primary care NPBoswell, Chelsafor evaluation of Parkinson's disease.Initial evaluation was on April 06, 2018  She has past medical history of hypertension, hyperlipidemia.  Patient reported a history of right hand tremor since summer 2019, smaller print with prolonged writing, she also noticed mild gait difficulty, difficulty initiate gait when first get up, unsteady gait, dragging her right leg across the floor, initially attributed to her hip, low back problem, had physical therapy which was helpful, also reported MRI of lumbar and thoracic spine by [facility] orthopedic surgeon reviewed degenerative disc disease, is planning on to have epidural injection.  She had decreased sense of smell for more than 10 years, denied constipation, occasionally orthostatic dizziness, tends to restless when sleep,   MRI of the brain in February 2020, showed no significant abnormalities, symptoms of PD include: tremor of right hand, gait difficulty, decreased sense of smell, restless sleeping  She joined a Parkinson's disease exercise group, she goes 3 times a week to do boxing, exercise. She has been lifting weights.   UPDATE July 08 2019: She complains of increased gait abnormality, freeze difficulty moving her left leg, increased left hand tremor,  Update March 02, 2020 SS: When last seen, Sinemet 25/100 mg 3 times daily was started, referred to physical therapy due to worsening symptoms, gait abnormality.  Currently taking Sinemet 9:30 am, 3:45 pm, 10:45 pm, notes wearing off effect, feeling tired around 2 and 3 afternoon, does her exercise at 4:00 is hard for her.  Just finished radiation for breast cancer, has started oral chemo Anastrozole. Hopes to get back to her  boxing, once cleared by oncologist.  Has noted big improvement with Sinemet, more confident in walking, less tremor to left hand, less fatigue.  No falls.  REVIEW OF SYSTEMS: Out of a complete 14 system review of symptoms, the patient complains only of the following symptoms, and all other reviewed systems are negative.  Walking difficulty, tremor  ALLERGIES: No Known Allergies  HOME MEDICATIONS: Outpatient Medications Prior to Visit  Medication Sig Dispense Refill   anastrozole (ARIMIDEX) 1 MG tablet Take 1 tablet (1 mg total) by mouth daily. 30 tablet 3   aspirin EC 81 MG tablet Take 81 mg by mouth daily.     Calcium Carb-Cholecalciferol (CALCIUM + D3 PO) Take 1 tablet by mouth daily.      cholecalciferol (VITAMIN D3) 25 MCG (1000 UNIT) tablet Take 1,000 Units by mouth daily.     Coenzyme Q10 (CO Q 10 PO) Take 200 mg by mouth daily.      lisinopril (PRINIVIL,ZESTRIL) 5 MG tablet Take 5 mg by mouth daily.     Multiple Vitamin (MULTIVITAMIN) tablet Take 1 tablet by mouth daily.     olopatadine (PATANOL) 0.1 % ophthalmic solution Place 1 drop into both eyes 2 (two) times daily.     Omega 3 1200 MG CAPS Take 1,200 mg by mouth daily.     simvastatin (ZOCOR) 20 MG tablet Take 20 mg by mouth at bedtime.      torsemide (DEMADEX) 20 MG tablet Take 20 mg by mouth daily.     carbidopa-levodopa (SINEMET IR) 25-100 MG tablet Take 1 tablet by mouth 3 (three) times daily. 90 tablet 11   HYDROcodone-acetaminophen (NORCO/VICODIN) 5-325 MG tablet Take 1 tablet by mouth  every 4 (four) hours as needed for moderate pain. 10 tablet 0   No facility-administered medications prior to visit.    PAST MEDICAL HISTORY: Past Medical History:  Diagnosis Date   Basal cell carcinoma    basal cell on left nose   Breast cancer (HCC)    Heart disease    Hypertension    Leaky heart valve    Parkinson disease ([place])    Ruptured disc, thoracic    Tremor     PAST SURGICAL HISTORY: Past  Surgical History:  Procedure Laterality Date   ABDOMINAL HYSTERECTOMY     complete   APPENDECTOMY     at the same time of hysterectomy   BASAL CELL CARCINOMA EXCISION     nose   BREAST LUMPECTOMY Left 12/29/2019   Procedure: BREAST LUMPECTOMY;  Surgeon: Robert Bellow, MD;  Location: [facility] ORS;  Service: General;  Laterality: Left;   CHOLECYSTECTOMY     EYE SURGERY  2021   TONSILLECTOMY      FAMILY HISTORY: Family History  Problem Relation Age of Onset   COPD Mother    Heart disease Father    Diabetes Paternal Grandmother    Breast cancer Neg Hx     SOCIAL HISTORY: Social History   Socioeconomic History   Marital status: Widowed    Spouse name: Not on file   Number of children: 3   Years of education: one year college   Highest education level: Not on file  Occupational History   Occupation: Retired  Tobacco Use   Smoking status: Never Smoker   Smokeless tobacco: Never Used  Scientific laboratory technician Use: Never used  Substance and Sexual Activity   Alcohol use: Not Currently   Drug use: Never   Sexual activity: Not Currently  Other Topics Concern   Not on file  Social History Narrative   Lives at home alone.   Right-handed.   2 cups caffeine daily.   Social Determinants of Health   Financial Resource Strain: Not on file  Food Insecurity: Not on file  Transportation Needs: Not on file  Physical Activity: Not on file  Stress: Not on file  Social Connections: Not on file  Intimate Partner Violence: Not on file   PHYSICAL EXAM  Vitals:   03/02/20 1054  BP: 120/64  Pulse: 70  Weight: 173 lb 6.4 oz (78.7 kg)  Height: 5\' 2"  (1.575 m)   Body mass index is 31.72 kg/m.  Generalized: Well developed, in no acute distress   Neurological examination  Mentation: Alert oriented to time, place, history taking. Follows all commands speech and language fluent Cranial nerve II-XII: Pupils were equal round reactive to light. Extraocular  movements were full, visual field were full on confrontational test. Facial sensation and strength were normal. Head turning and shoulder shrug  were normal and symmetric. Motor: The motor testing reveals 5 over 5 strength of all 4 extremities. Good symmetric motor tone is noted throughout.  Mild resting tremor left hand noted.  No significant bradykinesia. Sensory: Sensory testing is intact to soft touch on all 4 extremities. No evidence of extinction is noted.  Coordination: Cerebellar testing reveals good finger-nose-finger and heel-to-shin bilaterally.  Gait and station: Able to stand from seated position with arms crossed, mild decreased arm swing on the left, overall good strides, good turns Reflexes: Deep tendon reflexes are symmetric and normal bilaterally.   DIAGNOSTIC DATA (LABS, IMAGING, TESTING) - I reviewed patient records, labs, notes, testing and imaging  myself where available.  Lab Results  Component Value Date   WBC 6.1 02/09/2020   HGB 13.3 02/09/2020   HCT 40.4 02/09/2020   MCV 90.6 02/09/2020   PLT 228 02/09/2020   No results found for: NA, K, CL, CO2, GLUCOSE, BUN, CREATININE, CALCIUM, PROT, ALBUMIN, AST, ALT, ALKPHOS, BILITOT, GFRNONAA, GFRAA No results found for: CHOL, HDL, LDLCALC, LDLDIRECT, TRIG, CHOLHDL No results found for: HGBA1C No results found for: VITAMINB12 No results found for: TSH  ASSESSMENT AND PLAN 79 y.o. year old female  has a past medical history of Basal cell carcinoma, Breast cancer ([place]), Heart disease, Hypertension, Leaky heart valve, Parkinson disease ([place]), Ruptured disc, thoracic, and Tremor. here with:  1.  Idiopathic Parkinson's disease -Improvement with Sinemet (less tremor, walking steadier, less fatigue), continue Sinemet 25/100 mg 3 times daily -Change schedule to 9:30 am, 2:30 pm, 7:30 or 8 pm (move up afternoon, evening doses due to wearing off) -Continue exercise, hopes to get back into boxing once cleared by  oncologist -Follow-up in 6 months or sooner if needed  I spent 30 minutes of face-to-face and non-face-to-face time with patient.  This included previsit chart review, lab review, study review, order entry, electronic health record documentation, patient education.  Butler Denmark, AGNP-C, DNP 03/02/2020, 11:23 AM [facility] [address], [place], [place] [address] [phone]

## 2020-03-07 ENCOUNTER — Ambulatory Visit: Payer: Medicare HMO | Admitting: Neurology

## 2020-03-29 ENCOUNTER — Ambulatory Visit: Payer: Medicare HMO | Admitting: Radiation Oncology

## 2020-03-30 DIAGNOSIS — D0512 Intraductal carcinoma in situ of left breast: Secondary | ICD-10-CM | POA: Diagnosis not present

## 2020-04-06 ENCOUNTER — Other Ambulatory Visit: Payer: Self-pay | Admitting: Oncology

## 2020-04-12 ENCOUNTER — Ambulatory Visit: Payer: Medicare HMO | Admitting: Radiation Oncology

## 2020-05-02 ENCOUNTER — Encounter: Payer: Self-pay | Admitting: Oncology

## 2020-05-02 ENCOUNTER — Inpatient Hospital Stay: Payer: Medicare HMO | Admitting: Oncology

## 2020-05-02 ENCOUNTER — Ambulatory Visit
Admission: RE | Admit: 2020-05-02 | Discharge: 2020-05-02 | Disposition: A | Discharge status: Home / Self Care | Payer: Medicare HMO | Source: Ambulatory Visit | Attending: Radiation Oncology | Admitting: Radiation Oncology

## 2020-05-02 ENCOUNTER — Encounter: Payer: Self-pay | Admitting: Radiation Oncology

## 2020-05-02 ENCOUNTER — Inpatient Hospital Stay: Payer: Medicare HMO | Attending: Oncology

## 2020-05-02 VITALS — BP 122/83 | HR 66 | Temp 96.1°F | Wt 175.0 lb

## 2020-05-02 VITALS — BP 118/70 | HR 69 | Temp 97.6°F | Resp 20 | Wt 175.6 lb

## 2020-05-02 DIAGNOSIS — Z5181 Encounter for therapeutic drug level monitoring: Secondary | ICD-10-CM

## 2020-05-02 DIAGNOSIS — Z79899 Other long term (current) drug therapy: Secondary | ICD-10-CM | POA: Insufficient documentation

## 2020-05-02 DIAGNOSIS — G20 Parkinson's disease: Secondary | ICD-10-CM | POA: Diagnosis not present

## 2020-05-02 DIAGNOSIS — Z170 Estrogen receptor positive status [ER+]: Secondary | ICD-10-CM | POA: Diagnosis not present

## 2020-05-02 DIAGNOSIS — M85851 Other specified disorders of bone density and structure, right thigh: Secondary | ICD-10-CM

## 2020-05-02 DIAGNOSIS — R5383 Other fatigue: Secondary | ICD-10-CM | POA: Insufficient documentation

## 2020-05-02 DIAGNOSIS — Z85828 Personal history of other malignant neoplasm of skin: Secondary | ICD-10-CM | POA: Insufficient documentation

## 2020-05-02 DIAGNOSIS — I10 Essential (primary) hypertension: Secondary | ICD-10-CM | POA: Diagnosis not present

## 2020-05-02 DIAGNOSIS — Z79811 Long term (current) use of aromatase inhibitors: Secondary | ICD-10-CM | POA: Insufficient documentation

## 2020-05-02 DIAGNOSIS — D0512 Intraductal carcinoma in situ of left breast: Secondary | ICD-10-CM | POA: Insufficient documentation

## 2020-05-02 DIAGNOSIS — Z7982 Long term (current) use of aspirin: Secondary | ICD-10-CM | POA: Diagnosis not present

## 2020-05-02 DIAGNOSIS — C50412 Malignant neoplasm of upper-outer quadrant of left female breast: Secondary | ICD-10-CM

## 2020-05-02 LAB — CBC WITH DIFFERENTIAL/PLATELET
Abs Immature Granulocytes: 0.01 10*3/uL (ref 0.00–0.07)
Basophils Absolute: 0.1 10*3/uL (ref 0.0–0.1)
Basophils Relative: 1 %
Eosinophils Absolute: 0.2 10*3/uL (ref 0.0–0.5)
Eosinophils Relative: 4 %
HCT: 37.2 % (ref 36.0–46.0)
Hemoglobin: 12.2 g/dL (ref 12.0–15.0)
Immature Granulocytes: 0 %
Lymphocytes Relative: 40 %
Lymphs Abs: 2.6 10*3/uL (ref 0.7–4.0)
MCH: 30 pg (ref 26.0–34.0)
MCHC: 32.8 g/dL (ref 30.0–36.0)
MCV: 91.4 fL (ref 80.0–100.0)
Monocytes Absolute: 0.4 10*3/uL (ref 0.1–1.0)
Monocytes Relative: 6 %
Neutro Abs: 3.2 10*3/uL (ref 1.7–7.7)
Neutrophils Relative %: 49 %
Platelets: 255 10*3/uL (ref 150–400)
RBC: 4.07 MIL/uL (ref 3.87–5.11)
RDW: 13.4 % (ref 11.5–15.5)
WBC: 6.5 10*3/uL (ref 4.0–10.5)
nRBC: 0 % (ref 0.0–0.2)

## 2020-05-02 LAB — COMPREHENSIVE METABOLIC PANEL WITH GFR
ALT: 8 U/L (ref 0–44)
AST: 20 U/L (ref 15–41)
Albumin: 3.6 g/dL (ref 3.5–5.0)
Alkaline Phosphatase: 49 U/L (ref 38–126)
Anion gap: 12 (ref 5–15)
BUN: 16 mg/dL (ref 8–23)
CO2: 26 mmol/L (ref 22–32)
Calcium: 9.1 mg/dL (ref 8.9–10.3)
Chloride: 101 mmol/L (ref 98–111)
Creatinine, Ser: 0.83 mg/dL (ref 0.44–1.00)
GFR, Estimated: >60 mL/min
Glucose, Bld: 88 mg/dL (ref 70–99)
Potassium: 4 mmol/L (ref 3.5–5.1)
Sodium: 139 mmol/L (ref 135–145)
Total Bilirubin: 0.7 mg/dL (ref 0.3–1.2)
Total Protein: 6.8 g/dL (ref 6.5–8.1)

## 2020-05-02 NOTE — Progress Notes (Signed)
Radiation Oncology Follow up Note  Name: Sheila Mitchell   Date:   05/02/2020 MRN:  [id number] DOB: [birth year]    This 80 y.o. female presents to the clinic today for 1 month follow-up status post whole breast radiation to her left breast for Sheila Mitchell.  ER positive  REFERRING PROVIDER: Danelle Berry, NP  HPI: Patient is a 80 year old female now at 1 month having completed whole breast radiation to her left breast for ER positive Sheila Mitchell.  Seen today in routine follow-up she is doing well.  She specifically denies breast tenderness cough or bone pain..  She has been started on Arimidex tolerating it well without side effect.  COMPLICATIONS OF TREATMENT: none  FOLLOW UP COMPLIANCE: keeps appointments   PHYSICAL EXAM:  BP 122/83 (BP Location: Right Arm, Patient Position: Sitting, Cuff Size: Large)   Pulse 66   Temp (!) 96.1 F (35.6 C) (Tympanic)   Wt 175 lb (79.4 kg)   BMI 32.01 kg/m  Lungs are clear to A&P cardiac examination essentially unremarkable with regular rate and rhythm. No dominant mass or nodularity is noted in either breast in 2 positions examined. Incision is well-healed. No axillary or supraclavicular adenopathy is appreciated. Cosmetic result is excellent.  Well-developed well-nourished patient in NAD. HEENT reveals PERLA, EOMI, discs not visualized.  Oral cavity is clear. No oral mucosal lesions are identified. Neck is clear without evidence of cervical or supraclavicular adenopathy. Lungs are clear to A&P. Cardiac examination is essentially unremarkable with regular rate and rhythm without murmur rub or thrill. Abdomen is benign with no organomegaly or masses noted. Motor sensory and DTR levels are equal and symmetric in the upper and lower extremities. Cranial nerves II through XII are grossly intact. Proprioception is intact. No peripheral adenopathy or edema is identified. No motor or sensory levels are noted. Crude visual fields are  within normal range.  RADIOLOGY RESULTS: No current films to review  PLAN: Present time patient is doing well 1 month out from whole breast radiation.  I am pleased with her overall progress.  I have asked to see her back in 4 to 5 months for follow-up.  She continues on Arimidex without side effect.  Patient knows to call with any concerns.  I would like to take this opportunity to thank you for allowing me to participate in the care of your patient.Sheila Filbert, MD

## 2020-05-02 NOTE — Progress Notes (Signed)
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 She has not had any prior abnormal breast biopsy. She she is G0 P0 L0. Most recent mammogram in September 2021 showed an indeterminate left breast mass in the 3 o'clock position. No left axillary adenopathy. 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   Final pathology showed 11 mm grade 2 DCIS with negative margins within a fibroadenoma.  pTis P NX ER greater than 90% positive   Interval history-patient is tolerating Arimidex well.  She does report some increase in fatigue after starting Arimidex.  She did have some fatigue given prior to that but feels that it may be mildly worse.  She has baseline Parkinson's per is doing well overall from that perspective.  She takes her medications without missing any doses.  ECOG PS- 1 Pain scale- 0   Review of systems- Review of Systems  Constitutional: Positive for malaise/fatigue. Negative for chills, fever and weight loss.  HENT: Negative for congestion, ear discharge and nosebleeds.   Eyes: Negative for blurred vision.  Respiratory: Negative for cough, hemoptysis, sputum production, shortness of breath and wheezing.   Cardiovascular: Negative for chest pain, palpitations, orthopnea and claudication.  Gastrointestinal: Negative for abdominal pain, blood in stool, constipation, diarrhea, heartburn, melena, nausea and vomiting.  Genitourinary:  Negative for dysuria, flank pain, frequency, hematuria and urgency.  Musculoskeletal: Negative for back pain, joint pain and myalgias.  Skin: Negative for rash.  Neurological: Negative for dizziness, tingling, focal weakness, seizures, weakness and headaches.  Endo/Heme/Allergies: Does not bruise/bleed easily.  Psychiatric/Behavioral: Negative for depression and suicidal ideas. The patient does not have insomnia.       No Known Allergies   Past Medical History:  Diagnosis Date  . Basal cell carcinoma    basal cell on left nose  . Breast cancer ([place])   . Heart disease   . Hypertension   . Leaky heart valve   . Parkinson disease ([place])   . Ruptured disc, thoracic   . Tremor      Past Surgical History:  Procedure Laterality Date  . ABDOMINAL HYSTERECTOMY     complete  . APPENDECTOMY     at the same time of hysterectomy  . BASAL CELL CARCINOMA EXCISION     nose  . BREAST LUMPECTOMY Left 12/29/2019   Procedure: BREAST LUMPECTOMY;  Surgeon: Robert Bellow, MD;  Location: [facility] ORS;  Service: General;  Laterality: Left;  . CHOLECYSTECTOMY    . EYE SURGERY  2021  . TONSILLECTOMY      Social History   Socioeconomic History  . Marital status: Widowed    Spouse name: Not on file  . Number of children: 3  . Years of education: one year college  . Highest education level: Not on file  Occupational History  . Occupation: Retired  Tobacco Use  . Smoking status: Never Smoker  . Smokeless tobacco: Never Used  Vaping Use  . Vaping Use: Never used  Substance and Sexual Activity  . Alcohol use:  Not Currently  . Drug use: Never  . Sexual activity: Not Currently  Other Topics Concern  . Not on file  Social History Narrative   Lives at home alone.   Right-handed.   2 cups caffeine daily.   Social Determinants of Health   Financial Resource Strain: Not on file  Food Insecurity: Not on file  Transportation Needs: Not on file  Physical Activity: Not on file  Stress:  Not on file  Social Connections: Not on file  Intimate Partner Violence: Not on file    Family History  Problem Relation Age of Onset  . COPD Mother   . Heart disease Father   . Diabetes Paternal Grandmother   . Breast cancer Neg Hx      Current Outpatient Medications:  .  anastrozole (ARIMIDEX) 1 MG tablet, TAKE 1 TABLET BY MOUTH EVERY DAY, Disp: 90 tablet, Rfl: 1 .  aspirin EC 81 MG tablet, Take 81 mg by mouth daily., Disp: , Rfl:  .  Calcium Carb-Cholecalciferol (CALCIUM + D3 PO), Take 1 tablet by mouth daily. , Disp: , Rfl:  .  carbidopa-levodopa (SINEMET IR) 25-100 MG tablet, Take 1 tablet by mouth 3 (three) times daily., Disp: 90 tablet, Rfl: 11 .  cholecalciferol (VITAMIN D3) 25 MCG (1000 UNIT) tablet, Take 1,000 Units by mouth daily., Disp: , Rfl:  .  Coenzyme Q10 (CO Q 10 PO), Take 200 mg by mouth daily. , Disp: , Rfl:  .  lisinopril (PRINIVIL,ZESTRIL) 5 MG tablet, Take 5 mg by mouth daily., Disp: , Rfl:  .  Multiple Vitamin (MULTIVITAMIN) tablet, Take 1 tablet by mouth daily., Disp: , Rfl:  .  olopatadine (PATANOL) 0.1 % ophthalmic solution, Place 1 drop into both eyes 2 (two) times daily., Disp: , Rfl:  .  Omega 3 1200 MG CAPS, Take 1,200 mg by mouth daily., Disp: , Rfl:  .  simvastatin (ZOCOR) 20 MG tablet, Take 20 mg by mouth at bedtime. , Disp: , Rfl:  .  torsemide (DEMADEX) 20 MG tablet, Take 20 mg by mouth daily., Disp: , Rfl:   Physical exam:  Vitals:   05/02/20 1128  BP: 118/70  Pulse: 69  Resp: 20  Temp: 97.6 F (36.4 C)  TempSrc: Tympanic  SpO2: 100%  Weight: 175 lb 9.6 oz (79.7 kg)   Physical Exam Eyes:     Extraocular Movements: EOM normal.  Pulmonary:     Effort: Pulmonary effort is normal.  Skin:    General: Skin is warm and dry.  Neurological:     Mental Status: She is alert and oriented to person, place, and time.      CMP Latest Ref Rng & Units 05/02/2020  Glucose 70 - 99 mg/dL 88  BUN 8 - 23 mg/dL 16  Creatinine 0.44 - 1.00 mg/dL 0.83   Sodium 135 - 145 mmol/L 139  Potassium 3.5 - 5.1 mmol/L 4.0  Chloride 98 - 111 mmol/L 101  CO2 22 - 32 mmol/L 26  Calcium 8.9 - 10.3 mg/dL 9.1  Total Protein 6.5 - 8.1 g/dL 6.8  Total Bilirubin 0.3 - 1.2 mg/dL 0.7  Alkaline Phos 38 - 126 U/L 49  AST 15 - 41 U/L 20  ALT 0 - 44 U/L 8   CBC Latest Ref Rng & Units 05/02/2020  WBC 4.0 - 10.5 K/uL 6.5  Hemoglobin 12.0 - 15.0 g/dL 12.2  Hematocrit 36.0 - 46.0 % 37.2  Platelets 150 - 400 K/uL 255      Assessment  and plan- Patient is a 80 y.o. 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  She will continue taking that for now.  We discussed the results of her bone density scan which showed a T score of -2.0 at the right hip.  Her 10-year risk of a major osteoporotic fracture was 15% and major hip fracture was 4.4%.  Given that her hip fracture risk is more than 3% she could be a candidate for bisphosphonates at this time to prevent any further worsening of osteopenia.  I discussed the following options with her  1.  Given her existing osteopenia we could switch her from Arimidex to tamoxifen.  However this does carry some increased risk of blood clots cataracts. 2.  Continue Arimidex but consider adding bisphosphonates for osteopenia.  Discussed both options for Fosamax as well as yearly Reclast.  Discussed risks and benefits of Reclast including all but not limited to fatigue, hypocalcemia and possible risk of osteonecrosis of the jaw and need for dental clearance prior to that.  Discussed risks and benefits of Fosamax including all but not limited to increased risk of reflux. 3.  Continue Arimidex and monitor bone density scan closely without doing bisphosphonates yet.  Patient understands her options and has decided to proceed with option three.  I will see her back in 4 months for a routine breast exam.  She will  also continue to follow-up with Dr. Tollie Pizza and Dr. Cheron Schaumann   Visit Diagnosis 1. Osteopenia of right hip   2. Visit for monitoring Arimidex therapy   3. Ductal carcinoma in situ (DCIS) of left breast      Dr. Randa Evens, MD, MPH [facility] [phone] 05/02/2020 12:56 PM

## 2020-05-26 DIAGNOSIS — I10 Essential (primary) hypertension: Secondary | ICD-10-CM | POA: Diagnosis not present

## 2020-05-26 DIAGNOSIS — E785 Hyperlipidemia, unspecified: Secondary | ICD-10-CM | POA: Diagnosis not present

## 2020-05-26 DIAGNOSIS — E782 Mixed hyperlipidemia: Secondary | ICD-10-CM | POA: Diagnosis not present

## 2020-05-31 DIAGNOSIS — C50919 Malignant neoplasm of unspecified site of unspecified female breast: Secondary | ICD-10-CM | POA: Diagnosis not present

## 2020-05-31 DIAGNOSIS — G35 Multiple sclerosis: Secondary | ICD-10-CM | POA: Diagnosis not present

## 2020-05-31 DIAGNOSIS — N390 Urinary tract infection, site not specified: Secondary | ICD-10-CM | POA: Diagnosis not present

## 2020-05-31 DIAGNOSIS — E785 Hyperlipidemia, unspecified: Secondary | ICD-10-CM | POA: Diagnosis not present

## 2020-06-12 DIAGNOSIS — I340 Nonrheumatic mitral (valve) insufficiency: Secondary | ICD-10-CM | POA: Diagnosis not present

## 2020-06-12 DIAGNOSIS — I2510 Atherosclerotic heart disease of native coronary artery without angina pectoris: Secondary | ICD-10-CM | POA: Diagnosis not present

## 2020-06-12 DIAGNOSIS — I10 Essential (primary) hypertension: Secondary | ICD-10-CM | POA: Diagnosis not present

## 2020-06-12 DIAGNOSIS — I351 Nonrheumatic aortic (valve) insufficiency: Secondary | ICD-10-CM | POA: Diagnosis not present

## 2020-06-12 DIAGNOSIS — R0602 Shortness of breath: Secondary | ICD-10-CM | POA: Diagnosis not present

## 2020-06-19 DIAGNOSIS — R0602 Shortness of breath: Secondary | ICD-10-CM | POA: Diagnosis not present

## 2020-06-21 DIAGNOSIS — I351 Nonrheumatic aortic (valve) insufficiency: Secondary | ICD-10-CM | POA: Diagnosis not present

## 2020-06-21 DIAGNOSIS — R609 Edema, unspecified: Secondary | ICD-10-CM | POA: Diagnosis not present

## 2020-06-21 DIAGNOSIS — I10 Essential (primary) hypertension: Secondary | ICD-10-CM | POA: Diagnosis not present

## 2020-07-14 DIAGNOSIS — M25552 Pain in left hip: Secondary | ICD-10-CM | POA: Diagnosis not present

## 2020-07-14 DIAGNOSIS — M545 Low back pain, unspecified: Secondary | ICD-10-CM | POA: Diagnosis not present

## 2020-08-02 DIAGNOSIS — M1712 Unilateral primary osteoarthritis, left knee: Secondary | ICD-10-CM | POA: Diagnosis not present

## 2020-08-21 DIAGNOSIS — M47817 Spondylosis without myelopathy or radiculopathy, lumbosacral region: Secondary | ICD-10-CM | POA: Diagnosis not present

## 2020-08-25 DIAGNOSIS — M47817 Spondylosis without myelopathy or radiculopathy, lumbosacral region: Secondary | ICD-10-CM | POA: Diagnosis not present

## 2020-08-28 DIAGNOSIS — M47817 Spondylosis without myelopathy or radiculopathy, lumbosacral region: Secondary | ICD-10-CM | POA: Diagnosis not present

## 2020-08-31 ENCOUNTER — Ambulatory Visit: Payer: Medicare HMO | Admitting: Neurology

## 2020-08-31 ENCOUNTER — Encounter: Payer: Self-pay | Admitting: Neurology

## 2020-08-31 VITALS — BP 106/69 | HR 74 | Ht 62.0 in | Wt 177.0 lb

## 2020-08-31 DIAGNOSIS — M47817 Spondylosis without myelopathy or radiculopathy, lumbosacral region: Secondary | ICD-10-CM | POA: Diagnosis not present

## 2020-08-31 DIAGNOSIS — G20 Parkinson's disease: Secondary | ICD-10-CM

## 2020-08-31 MED ORDER — CARBIDOPA-LEVODOPA 25-100 MG PO TABS: 1.0000 | ORAL_TABLET | Freq: Three times a day (TID) | ORAL | 11 refills | Status: DC

## 2020-08-31 NOTE — Progress Notes (Signed)
PATIENT: Sheila Mitchell DOB: [birth year]  REASON FOR VISIT: follow up HISTORY FROM: patient  HISTORY OF PRESENT ILLNESS: Today 08/31/20  HISTORY   Sheila Mitchell is a 80 years old female, seen in request by her primary care NP Evern Bio for evaluation of Parkinson's disease.  Initial evaluation was on April 06, 2018    She has past medical history of hypertension, hyperlipidemia.   Patient reported a history of right hand tremor since summer 2019, smaller print with prolonged writing, she also noticed mild gait difficulty, difficulty initiate gait when first get up, unsteady gait, dragging her right leg across the floor, initially attributed to her hip, low back problem, had physical therapy which was helpful, also reported MRI of lumbar and thoracic spine by [facility] orthopedic surgeon reviewed degenerative disc disease, is planning on to have epidural injection.   She had decreased sense of smell for more than 10 years, denied constipation, occasionally orthostatic dizziness, tends to restless when sleep,   MRI of the brain in February 2020, showed no significant abnormalities, symptoms of PD include: tremor of right hand, gait difficulty, decreased sense of smell, restless sleeping    She joined a Parkinson's disease exercise group, she goes 3 times a week to do boxing, exercise.  She has been lifting weights.    UPDATE July 08 2019: She complains of increased gait abnormality, freeze difficulty moving her left leg, increased left hand tremor,  Update March 02, 2020 SS: When last seen, Sinemet 25/100 mg 3 times daily was started, referred to physical therapy due to worsening symptoms, gait abnormality.  Currently taking Sinemet 9:30 am, 3:45 pm, 10:45 pm, notes wearing off effect, feeling tired around 2 and 3 afternoon, does her exercise at 4:00 is hard for her.  Just finished radiation for breast cancer, has started oral chemo Anastrozole. Hopes to get back to her  boxing, once cleared by oncologist.  Has noted big improvement with Sinemet, more confident in walking, less tremor to left hand, less fatigue.  No falls.  Update August 31, 2020 SS: Back to boxing 3 times a week, left hip bursitis got a steroid shot is better, did some PT for low back pain, has been really good overall, 5 more sessions. PD feels stable. Taking Sinemet 25-100 mg 3 times daily 9:30 AM, 2:30, 7:30 PM, sees oncologist for breast cancer history coming up, remains on Arimidex. No problems getting out of chair. Hardly every tremor in left hand.   REVIEW OF SYSTEMS: Out of a complete 14 system review of symptoms, the patient complains only of the following symptoms, and all other reviewed systems are negative.  See HPI  ALLERGIES: No Known Allergies  HOME MEDICATIONS: Outpatient Medications Prior to Visit  Medication Sig Dispense Refill   anastrozole (ARIMIDEX) 1 MG tablet TAKE 1 TABLET BY MOUTH EVERY DAY 90 tablet 1   aspirin EC 81 MG tablet Take 81 mg by mouth daily.     Calcium Carb-Cholecalciferol (CALCIUM + D3 PO) Take 1 tablet by mouth in the morning and at bedtime.     cholecalciferol (VITAMIN D3) 25 MCG (1000 UNIT) tablet Take 1,000 Units by mouth daily.     Coenzyme Q10 (CO Q 10 PO) Take 200 mg by mouth daily.      lisinopril (PRINIVIL,ZESTRIL) 5 MG tablet Take 5 mg by mouth daily.     Multiple Vitamin (MULTIVITAMIN) tablet Take 1 tablet by mouth daily.     olopatadine (PATANOL) 0.1 % ophthalmic  solution Place 1 drop into both eyes 2 (two) times daily.     Omega 3 1200 MG CAPS Take 1,200 mg by mouth daily.     simvastatin (ZOCOR) 20 MG tablet Take 20 mg by mouth at bedtime.      torsemide (DEMADEX) 20 MG tablet Take 20 mg by mouth daily.     carbidopa-levodopa (SINEMET IR) 25-100 MG tablet Take 1 tablet by mouth 3 (three) times daily. 90 tablet 11   No facility-administered medications prior to visit.    PAST MEDICAL HISTORY: Past Medical History:  Diagnosis Date    Basal cell carcinoma    basal cell on left nose   Breast cancer (HCC)    Heart disease    Hypertension    Leaky heart valve    Parkinson disease ([place])    Ruptured disc, thoracic    Tremor     PAST SURGICAL HISTORY: Past Surgical History:  Procedure Laterality Date   ABDOMINAL HYSTERECTOMY     complete   APPENDECTOMY     at the same time of hysterectomy   BASAL CELL CARCINOMA EXCISION     nose   BREAST LUMPECTOMY Left 12/29/2019   Procedure: BREAST LUMPECTOMY;  Surgeon: Robert Bellow, MD;  Location: [facility] ORS;  Service: General;  Laterality: Left;   CHOLECYSTECTOMY     EYE SURGERY  2021   TONSILLECTOMY      FAMILY HISTORY: Family History  Problem Relation Age of Onset   COPD Mother    Heart disease Father    Diabetes Paternal Grandmother    Breast cancer Neg Hx     SOCIAL HISTORY: Social History   Socioeconomic History   Marital status: Widowed    Spouse name: Not on file   Number of children: 3   Years of education: one year college   Highest education level: Not on file  Occupational History   Occupation: Retired  Tobacco Use   Smoking status: Never   Smokeless tobacco: Never  Vaping Use   Vaping Use: Never used  Substance and Sexual Activity   Alcohol use: Not Currently   Drug use: Never   Sexual activity: Not Currently  Other Topics Concern   Not on file  Social History Narrative   Lives at home alone.   Right-handed.   2 cups caffeine daily.   Social Determinants of Health   Financial Resource Strain: Not on file  Food Insecurity: Not on file  Transportation Needs: Not on file  Physical Activity: Not on file  Stress: Not on file  Social Connections: Not on file  Intimate Partner Violence: Not on file   PHYSICAL EXAM  Vitals:   08/31/20 1241  BP: 106/69  Pulse: 74  Weight: 177 lb (80.3 kg)  Height: 5\' 2"  (1.575 m)    Body mass index is 32.37 kg/m.  Generalized: Well developed, in no acute distress   Neurological  examination  Mentation: Alert oriented to time, place, history taking. Follows all commands speech and language fluent Cranial nerve II-XII: Pupils were equal round reactive to light. Extraocular movements were full, visual field were full on confrontational test. Facial sensation and strength were normal. Head turning and shoulder shrug  were normal and symmetric. Motor: The motor testing reveals 5 over 5 strength of all 4 extremities. Mild resting tremor left hand noted.  No significant bradykinesia, slight increased rigidity to right upper extremity. Sensory: Sensory testing is intact to soft touch on all 4 extremities. No evidence of  extinction is noted.  Coordination: Cerebellar testing reveals good finger-nose-finger and heel-to-shin bilaterally.  Gait and station: Able to stand from seated position with arms crossed, good arm swing, turns, steady Reflexes: Deep tendon reflexes are symmetric and normal bilaterally.   DIAGNOSTIC DATA (LABS, IMAGING, TESTING) - I reviewed patient records, labs, notes, testing and imaging myself where available.  Lab Results  Component Value Date   WBC 6.5 05/02/2020   HGB 12.2 05/02/2020   HCT 37.2 05/02/2020   MCV 91.4 05/02/2020   PLT 255 05/02/2020      Component Value Date/Time   NA 139 05/02/2020 1044   K 4.0 05/02/2020 1044   CL 101 05/02/2020 1044   CO2 26 05/02/2020 1044   GLUCOSE 88 05/02/2020 1044   BUN 16 05/02/2020 1044   CREATININE 0.83 05/02/2020 1044   CALCIUM 9.1 05/02/2020 1044   PROT 6.8 05/02/2020 1044   ALBUMIN 3.6 05/02/2020 1044   AST 20 05/02/2020 1044   ALT 8 05/02/2020 1044   ALKPHOS 49 05/02/2020 1044   BILITOT 0.7 05/02/2020 1044   GFRNONAA >60 05/02/2020 1044   No results found for: CHOL, HDL, LDLCALC, LDLDIRECT, TRIG, CHOLHDL No results found for: HGBA1C No results found for: VITAMINB12 No results found for: TSH  ASSESSMENT AND PLAN 80 y.o. year old female  has a past medical history of Basal cell  carcinoma, Breast cancer ([place]), Heart disease, Hypertension, Leaky heart valve, Parkinson disease ([place]), Ruptured disc, thoracic, and Tremor. here with:  1.  Idiopathic Parkinson's disease  -Continues to do well, benefit from Sinemet (walking steadier, less tremor) -Continue Sinemet 25/100 mg 3 times daily -Encouraged to continue rock steady boxing, really enjoys this -No wearing off symptoms, hasn't tried other medications such as Mirapex, Requip, will continue current dose for now of Sinemet; She is really doing a good job to take care of herself -Follow-up in 6 months or sooner if needed, will check in with Dr. Marilynn Latino, AGNP-C, DNP 08/31/2020, 1:27 PM [facility] [address], [place], [place] [address] [phone]

## 2020-08-31 NOTE — Patient Instructions (Signed)
Keep up the good work! Continue Sinemet at current dosing Continue boxing class See you back in 6 months

## 2020-09-01 ENCOUNTER — Ambulatory Visit: Payer: Medicare HMO | Admitting: Oncology

## 2020-09-04 DIAGNOSIS — M47817 Spondylosis without myelopathy or radiculopathy, lumbosacral region: Secondary | ICD-10-CM | POA: Diagnosis not present

## 2020-09-07 DIAGNOSIS — M47817 Spondylosis without myelopathy or radiculopathy, lumbosacral region: Secondary | ICD-10-CM | POA: Diagnosis not present

## 2020-09-11 DIAGNOSIS — M47817 Spondylosis without myelopathy or radiculopathy, lumbosacral region: Secondary | ICD-10-CM | POA: Diagnosis not present

## 2020-09-13 DIAGNOSIS — H524 Presbyopia: Secondary | ICD-10-CM | POA: Diagnosis not present

## 2020-09-14 DIAGNOSIS — M47817 Spondylosis without myelopathy or radiculopathy, lumbosacral region: Secondary | ICD-10-CM | POA: Diagnosis not present

## 2020-09-22 ENCOUNTER — Encounter: Payer: Self-pay | Admitting: Oncology

## 2020-09-22 ENCOUNTER — Inpatient Hospital Stay: Payer: Medicare HMO | Attending: Oncology | Admitting: Oncology

## 2020-09-22 VITALS — BP 95/63 | HR 78 | Temp 98.2°F | Resp 20 | Wt 179.2 lb

## 2020-09-22 DIAGNOSIS — I10 Essential (primary) hypertension: Secondary | ICD-10-CM | POA: Insufficient documentation

## 2020-09-22 DIAGNOSIS — D0512 Intraductal carcinoma in situ of left breast: Secondary | ICD-10-CM | POA: Diagnosis not present

## 2020-09-22 DIAGNOSIS — Z85828 Personal history of other malignant neoplasm of skin: Secondary | ICD-10-CM | POA: Insufficient documentation

## 2020-09-22 DIAGNOSIS — G20 Parkinson's disease: Secondary | ICD-10-CM | POA: Diagnosis not present

## 2020-09-22 DIAGNOSIS — Z79811 Long term (current) use of aromatase inhibitors: Secondary | ICD-10-CM | POA: Insufficient documentation

## 2020-09-22 DIAGNOSIS — Z5181 Encounter for therapeutic drug level monitoring: Secondary | ICD-10-CM

## 2020-09-22 DIAGNOSIS — Z08 Encounter for follow-up examination after completed treatment for malignant neoplasm: Secondary | ICD-10-CM | POA: Diagnosis not present

## 2020-09-22 DIAGNOSIS — Z79899 Other long term (current) drug therapy: Secondary | ICD-10-CM | POA: Insufficient documentation

## 2020-09-22 DIAGNOSIS — Z7982 Long term (current) use of aspirin: Secondary | ICD-10-CM | POA: Insufficient documentation

## 2020-09-22 DIAGNOSIS — Z170 Estrogen receptor positive status [ER+]: Secondary | ICD-10-CM | POA: Diagnosis not present

## 2020-09-22 DIAGNOSIS — Z853 Personal history of malignant neoplasm of breast: Secondary | ICD-10-CM | POA: Diagnosis not present

## 2020-09-22 NOTE — Progress Notes (Signed)
Survivorship Care Plan visit completed.  Treatment summary reviewed and given to patient.  ASCO answers booklet reviewed and given to patient.  CARE program and Cancer Transitions discussed with patient along with other resources cancer center offers to patients and caregivers.  Patient verbalized understanding.    

## 2020-09-22 NOTE — Progress Notes (Signed)
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  She has not had any prior abnormal breast biopsy.  She she is G0 P0 L0.  Most recent mammogram in September 2021 showed an indeterminate left breast mass in the 3 o'clock position.  No left axillary adenopathy.  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    Final pathology showed 11 mm grade 2 DCIS with negative margins within a fibroadenoma.  pTis P NX ER greater than 90% positive  Patient started taking Arimidex in October 2021  Interval history-patient reports doing well on Arimidex.  Has occasional hot flashes which is self-limited.  Denies any arthralgias.  ECOG PS- 1 Pain scale- 0   Review of systems- Review of Systems  Constitutional:  Negative for chills, fever, malaise/fatigue and weight loss.  HENT:  Negative for congestion, ear discharge and nosebleeds.   Eyes:  Negative for blurred vision.  Respiratory:  Negative for cough, hemoptysis, sputum production, shortness of breath and wheezing.   Cardiovascular:  Negative for chest pain, palpitations, orthopnea and claudication.  Gastrointestinal:  Negative for abdominal pain, blood in stool, constipation, diarrhea, heartburn, melena, nausea and  vomiting.  Genitourinary:  Negative for dysuria, flank pain, frequency, hematuria and urgency.  Musculoskeletal:  Negative for back pain, joint pain and myalgias.  Skin:  Negative for rash.  Neurological:  Negative for dizziness, tingling, focal weakness, seizures, weakness and headaches.  Endo/Heme/Allergies:  Does not bruise/bleed easily.  Psychiatric/Behavioral:  Negative for depression and suicidal ideas. The patient does not have insomnia.       No Known Allergies   Past Medical History:  Diagnosis Date   Basal cell carcinoma    basal cell on left nose   Breast cancer (HCC)    Heart disease    Hypertension    Leaky heart valve    Parkinson disease ([place])    Ruptured disc, thoracic    Tremor      Past Surgical History:  Procedure Laterality Date   ABDOMINAL HYSTERECTOMY     complete   APPENDECTOMY     at the same time of hysterectomy   BASAL CELL CARCINOMA EXCISION     nose   BREAST LUMPECTOMY Left 12/29/2019   Procedure: BREAST LUMPECTOMY;  Surgeon: Robert Bellow, MD;  Location: [facility] ORS;  Service: General;  Laterality: Left;   CHOLECYSTECTOMY     EYE SURGERY  2021   TONSILLECTOMY      Social History   Socioeconomic History   Marital status: Widowed    Spouse name: Not on file   Number of children: 3   Years of education: one year college   Highest education level: Not on file  Occupational History   Occupation: Retired  Tobacco Use   Smoking status: Never   Smokeless tobacco: Never  Vaping Use   Vaping Use: Never used  Substance and Sexual Activity   Alcohol use: Not Currently   Drug use: Never   Sexual activity: Not Currently  Other Topics Concern   Not on file  Social History Narrative   Lives at home alone.   Right-handed.   2 cups caffeine daily.   Social Determinants of Health   Financial Resource Strain: Not on file  Food Insecurity: Not on file  Transportation Needs: Not on file  Physical Activity: Not on file  Stress: Not on  file  Social Connections: Not on file  Intimate Partner Violence: Not on file    Family History  Problem Relation Age of Onset   COPD Mother    Heart disease Father    Diabetes Paternal Grandmother    Breast cancer Neg Hx      Current Outpatient Medications:    anastrozole (ARIMIDEX) 1 MG tablet, TAKE 1 TABLET BY MOUTH EVERY DAY, Disp: 90 tablet, Rfl: 1   aspirin EC 81 MG tablet, Take 81 mg by mouth daily., Disp: , Rfl:    Calcium Carb-Cholecalciferol (CALCIUM + D3 PO), Take 1 tablet by mouth in the morning and at bedtime., Disp: , Rfl:    carbidopa-levodopa (SINEMET IR) 25-100 MG tablet, Take 1 tablet by mouth 3 (three) times daily., Disp: 90 tablet, Rfl: 11   cholecalciferol (VITAMIN D3) 25 MCG (1000 UNIT) tablet, Take 1,000 Units by mouth daily., Disp: , Rfl:    Coenzyme Q10 (CO Q 10 PO), Take 200 mg by mouth daily. , Disp: , Rfl:    lisinopril (PRINIVIL,ZESTRIL) 5 MG tablet, Take 5 mg by mouth daily., Disp: , Rfl:    Multiple Vitamin (MULTIVITAMIN) tablet, Take 1 tablet by mouth daily., Disp: , Rfl:    simvastatin (ZOCOR) 20 MG tablet, Take 20 mg by mouth at bedtime. , Disp: , Rfl:    torsemide (DEMADEX) 20 MG tablet, Take 20 mg by mouth daily., Disp: , Rfl:   Physical exam:  Vitals:   09/22/20 1402  BP: 95/63  Pulse: 78  Resp: 20  Temp: 98.2 F (36.8 C)  TempSrc: Tympanic  SpO2: 97%  Weight: 179 lb 3.2 oz (81.3 kg)   Physical Exam Constitutional:      General: She is not in acute distress. Cardiovascular:     Rate and Rhythm: Normal rate and regular rhythm.     Heart sounds: Normal heart sounds.  Pulmonary:     Effort: Pulmonary effort is normal.     Breath sounds: Normal breath sounds.  Abdominal:     General: Bowel sounds are normal.     Palpations: Abdomen is soft.  Skin:    General: Skin is warm and dry.  Neurological:     Mental Status: She is alert and oriented to person, place, and time.   Breast exam was performed in seated and lying down  position. Patient is status post left lumpectomy with a well-healed surgical scar. No evidence of any palpable masses. No evidence of axillary adenopathy. No evidence of any palpable masses or lumps in the right breast. No evidence of right axillary adenopathy   CMP Latest Ref Rng & Units 05/02/2020  Glucose 70 - 99 mg/dL 88  BUN 8 - 23 mg/dL 16  Creatinine 0.44 - 1.00 mg/dL 0.83  Sodium 135 - 145 mmol/L 139  Potassium 3.5 - 5.1 mmol/L 4.0  Chloride 98 - 111 mmol/L 101  CO2 22 - 32 mmol/L 26  Calcium 8.9 - 10.3 mg/dL 9.1  Total Protein 6.5 -  8.1 g/dL 6.8  Total Bilirubin 0.3 - 1.2 mg/dL 0.7  Alkaline Phos 38 - 126 U/L 49  AST 15 - 41 U/L 20  ALT 0 - 44 U/L 8   CBC Latest Ref Rng & Units 05/02/2020  WBC 4.0 - 10.5 K/uL 6.5  Hemoglobin 12.0 - 15.0 g/dL 12.2  Hematocrit 36.0 - 46.0 % 37.2  Platelets 150 - 400 K/uL 255     Assessment and plan- Patient is a 80 y.o. 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  She will be due for a mammogram in August 2022 which will be coordinated by Dr. Bary Castilla.  She is tolerating Arimidex well without any significant side effects which she will continue.  I will see her back in 6 months.  Plan to repeat bone density scan next year   Visit Diagnosis 1. Encounter for follow-up surveillance of breast cancer   2. Visit for monitoring Arimidex therapy      Dr. Randa Evens, MD, MPH [facility] [phone] 09/22/2020 4:35 PM

## 2020-09-26 ENCOUNTER — Other Ambulatory Visit: Payer: Self-pay | Admitting: General Surgery

## 2020-09-26 DIAGNOSIS — D0512 Intraductal carcinoma in situ of left breast: Secondary | ICD-10-CM

## 2020-09-27 DIAGNOSIS — M47817 Spondylosis without myelopathy or radiculopathy, lumbosacral region: Secondary | ICD-10-CM | POA: Diagnosis not present

## 2020-10-04 ENCOUNTER — Ambulatory Visit
Admission: RE | Admit: 2020-10-04 | Discharge: 2020-10-04 | Disposition: A | Discharge status: Home / Self Care | Payer: Medicare HMO | Source: Ambulatory Visit | Attending: Radiation Oncology | Admitting: Radiation Oncology

## 2020-10-04 ENCOUNTER — Other Ambulatory Visit: Payer: Self-pay

## 2020-10-04 ENCOUNTER — Encounter: Payer: Self-pay | Admitting: Radiation Oncology

## 2020-10-04 VITALS — BP 103/66 | HR 72 | Temp 96.7°F | Wt 178.0 lb

## 2020-10-04 DIAGNOSIS — Z170 Estrogen receptor positive status [ER+]: Secondary | ICD-10-CM | POA: Diagnosis not present

## 2020-10-04 DIAGNOSIS — C50412 Malignant neoplasm of upper-outer quadrant of left female breast: Secondary | ICD-10-CM

## 2020-10-04 DIAGNOSIS — Z79811 Long term (current) use of aromatase inhibitors: Secondary | ICD-10-CM | POA: Insufficient documentation

## 2020-10-04 DIAGNOSIS — D0512 Intraductal carcinoma in situ of left breast: Secondary | ICD-10-CM | POA: Insufficient documentation

## 2020-10-04 DIAGNOSIS — Z923 Personal history of irradiation: Secondary | ICD-10-CM | POA: Diagnosis not present

## 2020-10-04 DIAGNOSIS — Z08 Encounter for follow-up examination after completed treatment for malignant neoplasm: Secondary | ICD-10-CM | POA: Diagnosis not present

## 2020-10-24 ENCOUNTER — Other Ambulatory Visit: Payer: Self-pay | Admitting: *Deleted

## 2020-10-24 MED ORDER — ANASTROZOLE 1 MG PO TABS
1.0000 mg | ORAL_TABLET | Freq: Every day | ORAL | 1 refills | Status: DC
Start: 2020-10-24 — End: 2021-02-26

## 2020-11-15 ENCOUNTER — Ambulatory Visit
Admission: RE | Admit: 2020-11-15 | Discharge: 2020-11-15 | Disposition: A | Discharge status: Home / Self Care | Payer: Medicare HMO | Source: Ambulatory Visit | Attending: General Surgery | Admitting: General Surgery

## 2020-11-15 ENCOUNTER — Other Ambulatory Visit: Payer: Self-pay

## 2020-11-15 DIAGNOSIS — D0512 Intraductal carcinoma in situ of left breast: Secondary | ICD-10-CM

## 2020-11-15 DIAGNOSIS — R922 Inconclusive mammogram: Secondary | ICD-10-CM | POA: Diagnosis not present

## 2020-11-15 DIAGNOSIS — Z853 Personal history of malignant neoplasm of breast: Secondary | ICD-10-CM | POA: Diagnosis not present

## 2020-11-15 HISTORY — DX: Personal history of irradiation: Z92.3

## 2020-11-21 DIAGNOSIS — D0512 Intraductal carcinoma in situ of left breast: Secondary | ICD-10-CM | POA: Diagnosis not present

## 2020-11-28 DIAGNOSIS — E782 Mixed hyperlipidemia: Secondary | ICD-10-CM | POA: Diagnosis not present

## 2020-11-28 DIAGNOSIS — E785 Hyperlipidemia, unspecified: Secondary | ICD-10-CM | POA: Diagnosis not present

## 2020-11-28 DIAGNOSIS — R7301 Impaired fasting glucose: Secondary | ICD-10-CM | POA: Diagnosis not present

## 2020-11-28 DIAGNOSIS — E663 Overweight: Secondary | ICD-10-CM | POA: Diagnosis not present

## 2020-11-28 DIAGNOSIS — I10 Essential (primary) hypertension: Secondary | ICD-10-CM | POA: Diagnosis not present

## 2020-12-13 DIAGNOSIS — G20 Parkinson's disease: Secondary | ICD-10-CM | POA: Diagnosis not present

## 2020-12-13 DIAGNOSIS — E785 Hyperlipidemia, unspecified: Secondary | ICD-10-CM | POA: Diagnosis not present

## 2020-12-13 DIAGNOSIS — R5383 Other fatigue: Secondary | ICD-10-CM | POA: Diagnosis not present

## 2020-12-13 DIAGNOSIS — E782 Mixed hyperlipidemia: Secondary | ICD-10-CM | POA: Diagnosis not present

## 2020-12-13 DIAGNOSIS — Z23 Encounter for immunization: Secondary | ICD-10-CM | POA: Diagnosis not present

## 2020-12-13 DIAGNOSIS — R7301 Impaired fasting glucose: Secondary | ICD-10-CM | POA: Diagnosis not present

## 2020-12-13 DIAGNOSIS — I351 Nonrheumatic aortic (valve) insufficiency: Secondary | ICD-10-CM | POA: Diagnosis not present

## 2020-12-13 DIAGNOSIS — I10 Essential (primary) hypertension: Secondary | ICD-10-CM | POA: Diagnosis not present

## 2020-12-14 DIAGNOSIS — D2262 Melanocytic nevi of left upper limb, including shoulder: Secondary | ICD-10-CM | POA: Diagnosis not present

## 2020-12-14 DIAGNOSIS — D2261 Melanocytic nevi of right upper limb, including shoulder: Secondary | ICD-10-CM | POA: Diagnosis not present

## 2020-12-14 DIAGNOSIS — D2271 Melanocytic nevi of right lower limb, including hip: Secondary | ICD-10-CM | POA: Diagnosis not present

## 2020-12-14 DIAGNOSIS — D2272 Melanocytic nevi of left lower limb, including hip: Secondary | ICD-10-CM | POA: Diagnosis not present

## 2020-12-14 DIAGNOSIS — L821 Other seborrheic keratosis: Secondary | ICD-10-CM | POA: Diagnosis not present

## 2020-12-14 DIAGNOSIS — Z85828 Personal history of other malignant neoplasm of skin: Secondary | ICD-10-CM | POA: Diagnosis not present

## 2020-12-14 DIAGNOSIS — Z08 Encounter for follow-up examination after completed treatment for malignant neoplasm: Secondary | ICD-10-CM | POA: Diagnosis not present

## 2021-01-04 DIAGNOSIS — E782 Mixed hyperlipidemia: Secondary | ICD-10-CM | POA: Diagnosis not present

## 2021-01-04 DIAGNOSIS — I2510 Atherosclerotic heart disease of native coronary artery without angina pectoris: Secondary | ICD-10-CM | POA: Diagnosis not present

## 2021-01-04 DIAGNOSIS — I10 Essential (primary) hypertension: Secondary | ICD-10-CM | POA: Diagnosis not present

## 2021-01-04 DIAGNOSIS — I351 Nonrheumatic aortic (valve) insufficiency: Secondary | ICD-10-CM | POA: Diagnosis not present

## 2021-01-04 DIAGNOSIS — I340 Nonrheumatic mitral (valve) insufficiency: Secondary | ICD-10-CM | POA: Diagnosis not present

## 2021-01-10 DIAGNOSIS — M7062 Trochanteric bursitis, left hip: Secondary | ICD-10-CM | POA: Diagnosis not present

## 2021-01-30 NOTE — Progress Notes (Signed)
Radiation Oncology Follow up Note  Name: Sheila Mitchell   Date:   10/04/2020 MRN:  [id number] DOB: November 07, 1940    This 80 y.o. female presents to the clinic today for 85-month follow-up status post whole breast radiation to her left breast for ductal carcinoma in situ ER positive.  REFERRING PROVIDER: Danelle Berry, NP  HPI: Patient is a 80 year old female now seen out 6 months having pleated whole breast radiation to her left breast for ductal carcinoma in situ ER positive.  Seen today in routine follow-up she is doing well.  Specifically denies breast tenderness cough or bone pain..  She is currently on Arimidex tolerating that well without side effect.  She has mammogram scheduled for next month.  COMPLICATIONS OF TREATMENT: none  FOLLOW UP COMPLIANCE: keeps appointments   PHYSICAL EXAM:  BP 103/66   Pulse 72   Temp (!) 96.7 F (35.9 C) (Tympanic)   Wt 178 lb (80.7 kg)   BMI 32.56 kg/m  Lungs are clear to A&P cardiac examination essentially unremarkable with regular rate and rhythm. No dominant mass or nodularity is noted in either breast in 2 positions examined. Incision is well-healed. No axillary or supraclavicular adenopathy is appreciated. Cosmetic result is excellent.  Well-developed well-nourished patient in NAD. HEENT reveals PERLA, EOMI, discs not visualized.  Oral cavity is clear. No oral mucosal lesions are identified. Neck is clear without evidence of cervical or supraclavicular adenopathy. Lungs are clear to A&P. Cardiac examination is essentially unremarkable with regular rate and rhythm without murmur rub or thrill. Abdomen is benign with no organomegaly or masses noted. Motor sensory and DTR levels are equal and symmetric in the upper and lower extremities. Cranial nerves II through XII are grossly intact. Proprioception is intact. No peripheral adenopathy or edema is identified. No motor or sensory levels are noted. Crude visual fields are within normal  range.  RADIOLOGY RESULTS: No current films for review  PLAN: Present time patient is doing well no significant side effects from her prior radiation therapy.  And pleased with her overall progress.  Of asked to see her back in 6 months for follow-up.  Patient knows to call with any concerns.  I would like to take this opportunity to thank you for allowing me to participate in the care of your patient.Noreene Filbert, MD

## 2021-02-26 ENCOUNTER — Encounter: Payer: Self-pay | Admitting: Neurology

## 2021-02-26 ENCOUNTER — Ambulatory Visit: Payer: Medicare HMO | Admitting: Neurology

## 2021-02-26 ENCOUNTER — Other Ambulatory Visit: Payer: Self-pay

## 2021-02-26 ENCOUNTER — Other Ambulatory Visit: Payer: Self-pay | Admitting: Oncology

## 2021-02-26 VITALS — BP 112/68 | HR 76 | Ht 62.0 in | Wt 178.0 lb

## 2021-02-26 DIAGNOSIS — G20 Parkinson's disease: Secondary | ICD-10-CM | POA: Diagnosis not present

## 2021-02-26 MED ORDER — RASAGILINE MESYLATE 1 MG PO TABS: 1.0000 mg | ORAL_TABLET | Freq: Every day | ORAL | 11 refills | Status: DC

## 2021-02-26 MED ORDER — CARBIDOPA-LEVODOPA 25-100 MG PO TABS
1.0000 | ORAL_TABLET | Freq: Three times a day (TID) | ORAL | 4 refills | Status: DC
Start: 2021-02-26 — End: 2021-09-05

## 2021-02-26 NOTE — Progress Notes (Signed)
ASSESSMENT AND PLAN 80 y.o. year old female    Idiopathic Parkinson's disease  -Continues to do well, benefit from Sinemet 25/100 mg 3 times a day, Continue moderate exercise, participating group boxing, Add on Azilect 1 mg daily  HISTORY OF PRESENT ILLNESS: Sheila Mitchell is a 80 years old female, seen in request by her primary care NP Evern Bio for evaluation of Parkinson's disease.  Initial evaluation was on April 06, 2018    She has past medical history of hypertension, hyperlipidemia.   Patient reported a history of right hand tremor since summer 2019, smaller print with prolonged writing, she also noticed mild gait difficulty, difficulty initiate gait when first get up, unsteady gait, dragging her right leg across the floor, initially attributed to her hip, low back problem, had physical therapy which was helpful, also reported MRI of lumbar and thoracic spine by [facility] orthopedic surgeon reviewed degenerative disc disease, is planning on to have epidural injection.   She had decreased sense of smell for more than 10 years, denied constipation, occasionally orthostatic dizziness, tends to restless when sleep,   MRI of the brain in February 2020, showed no significant abnormalities, symptoms of PD include: tremor of right hand, gait difficulty, decreased sense of smell, restless sleeping    She joined a Parkinson's disease exercise group, she goes 3 times a week to do boxing, exercise.  She has been lifting weights.    UPDATE July 08 2019: She complains of increased gait abnormality, freeze difficulty moving her left leg, increased left hand tremor,  UPDATE Feb 26 2021: She is overall doing very well, taking Sinemet 25/100 mg twice daily, also exercises regularly, participate in rock boxing,   REVIEW OF SYSTEMS: Out of a complete 14 system review of symptoms, the patient complains only of the following symptoms, and all other reviewed systems are  negative.  See HPI  ALLERGIES: No Known Allergies  HOME MEDICATIONS: Outpatient Medications Prior to Visit  Medication Sig Dispense Refill   anastrozole (ARIMIDEX) 1 MG tablet Take 1 tablet (1 mg total) by mouth daily. 90 tablet 1   aspirin EC 81 MG tablet Take 81 mg by mouth daily.     Calcium Carb-Cholecalciferol (CALCIUM + D3 PO) Take 1 tablet by mouth in the morning and at bedtime.     carbidopa-levodopa (SINEMET IR) 25-100 MG tablet Take 1 tablet by mouth 3 (three) times daily. 90 tablet 11   cholecalciferol (VITAMIN D3) 25 MCG (1000 UNIT) tablet Take 1,000 Units by mouth daily.     Coenzyme Q10 (CO Q 10 PO) Take 200 mg by mouth daily.      lisinopril (PRINIVIL,ZESTRIL) 5 MG tablet Take 5 mg by mouth daily.     Multiple Vitamin (MULTIVITAMIN) tablet Take 1 tablet by mouth daily.     simvastatin (ZOCOR) 20 MG tablet Take 20 mg by mouth at bedtime.      torsemide (DEMADEX) 20 MG tablet Take 20 mg by mouth daily.     UNABLE TO FIND Take 3 capsules by mouth daily. Med Name: Balance of Petra Kuba Supplement     No facility-administered medications prior to visit.    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, clear margins   Heart disease    Hypertension    Leaky heart valve    Parkinson disease (  [place])    Personal history of radiation therapy    2021, left breast ca   Ruptured disc, thoracic    Tremor     PAST SURGICAL HISTORY: Past Surgical History:  Procedure Laterality Date   ABDOMINAL HYSTERECTOMY     complete   APPENDECTOMY     at the same time of hysterectomy   BASAL CELL CARCINOMA EXCISION     nose   BREAST BIOPSY Left 11/30/2019   [place] bx, DCIS   BREAST LUMPECTOMY Left 12/29/2019   Procedure: BREAST LUMPECTOMY;  Surgeon: Robert Bellow, MD;  Location: [facility] ORS;  Service: General;  Laterality: Left;   CHOLECYSTECTOMY     EYE SURGERY  2021    TONSILLECTOMY      FAMILY HISTORY: Family History  Problem Relation Age of Onset   COPD Mother    Heart disease Father    Diabetes Paternal Grandmother    Breast cancer Neg Hx     SOCIAL HISTORY: Social History   Socioeconomic History   Marital status: Widowed    Spouse name: Not on file   Number of children: 3   Years of education: one year college   Highest education level: Not on file  Occupational History   Occupation: Retired  Tobacco Use   Smoking status: Never   Smokeless tobacco: Never  Vaping Use   Vaping Use: Never used  Substance and Sexual Activity   Alcohol use: Not Currently   Drug use: Never   Sexual activity: Not Currently  Other Topics Concern   Not on file  Social History Narrative   Lives at home alone.   Right-handed.   2 cups caffeine daily.   Social Determinants of Health   Financial Resource Strain: Not on file  Food Insecurity: Not on file  Transportation Needs: Not on file  Physical Activity: Not on file  Stress: Not on file  Social Connections: Not on file  Intimate Partner Violence: Not on file   PHYSICAL EXAM  Vitals:   02/26/21 1331  BP: 112/68  Pulse: 76  Weight: 178 lb (80.7 kg)  Height: 5\' 2"  (1.575 m)    Body mass index is 32.56 kg/m.  Generalized: Well developed, in no acute distress    PHYSICAL EXAMNIATION:  Gen: NAD, conversant, well nourised, well groomed                     Cardiovascular: Regular rate rhythm, no peripheral edema, warm, nontender. Eyes: Conjunctivae clear without exudates or hemorrhage Neck: Supple, no carotid bruits. Pulmonary: Clear to auscultation bilaterally   NEUROLOGICAL EXAM:  MENTAL STATUS: Speech/Cognition: Awake, alert, normal speech, oriented to history taking and casual conversation.  CRANIAL NERVES: CN II: Visual fields are full to confrontation.  Pupils are round equal and briskly reactive to light. CN III, IV, VI: extraocular movement are normal. No ptosis. CN V:  Facial sensation is intact to light touch. CN VII: Face is symmetric with normal eye closure and smile. CN VIII: Hearing is normal to casual conversation CN IX, X: Palate elevates symmetrically. Phonation is normal. CN XI: Head turning and shoulder shrug are intact CN XII: Tongue is midline with normal movements and no atrophy.  MOTOR:mild left hand resting tremor, mild left more than right rigidity, bradykinesia  REFLEXES: Reflexes are 1 and symmetric at the biceps, triceps, knees and ankles. Plantar responses are flexor.  SENSORY: Intact to light touch, pinprick, positional and vibratory sensation at fingers and toes.  COORDINATION: There  is no trunk or limb ataxia.    GAIT/STANCE: She can get up from seated position arm crossed, steady, moderate stride, smooth turning  DIAGNOSTIC DATA (LABS, IMAGING, TESTING) - I reviewed patient records, labs, notes, testing and imaging myself where available.  Lab Results  Component Value Date   WBC 6.5 05/02/2020   HGB 12.2 05/02/2020   HCT 37.2 05/02/2020   MCV 91.4 05/02/2020   PLT 255 05/02/2020      Component Value Date/Time   NA 139 05/02/2020 1044   K 4.0 05/02/2020 1044   CL 101 05/02/2020 1044   CO2 26 05/02/2020 1044   GLUCOSE 88 05/02/2020 1044   BUN 16 05/02/2020 1044   CREATININE 0.83 05/02/2020 1044   CALCIUM 9.1 05/02/2020 1044   PROT 6.8 05/02/2020 1044   ALBUMIN 3.6 05/02/2020 1044   AST 20 05/02/2020 1044   ALT 8 05/02/2020 1044   ALKPHOS 49 05/02/2020 1044   BILITOT 0.7 05/02/2020 1044   GFRNONAA >60 05/02/2020 1044   No results found for: CHOL, HDL, LDLCALC, LDLDIRECT, TRIG, CHOLHDL No results found for: HGBA1C No results found for: VITAMINB12 No results found for: TSH   Marcial Pacas, M.D. Ph.D.  [facility] [place], [place] [address] Phone: [phone] Fax:      [phone]

## 2021-03-21 ENCOUNTER — Other Ambulatory Visit: Payer: Self-pay | Admitting: Neurology

## 2021-03-26 ENCOUNTER — Inpatient Hospital Stay: Payer: Medicare HMO | Attending: Oncology | Admitting: Oncology

## 2021-03-26 ENCOUNTER — Other Ambulatory Visit: Payer: Self-pay

## 2021-03-26 ENCOUNTER — Encounter: Payer: Self-pay | Admitting: Oncology

## 2021-03-26 DIAGNOSIS — M85851 Other specified disorders of bone density and structure, right thigh: Secondary | ICD-10-CM | POA: Diagnosis not present

## 2021-03-26 DIAGNOSIS — D0512 Intraductal carcinoma in situ of left breast: Secondary | ICD-10-CM | POA: Diagnosis not present

## 2021-03-26 NOTE — Progress Notes (Signed)
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   I discussed the limitations, risks, security and privacy concerns of performing an evaluation and management service by telemedicine and the availability of in-person appointments. I also discussed with the patient that there may be a patient responsible charge related to this service. The patient expressed understanding and agreed to proceed.   Other persons participating in the visit and their role in the encounter: NP, Patient    Patients location: Home  Providers location: Clinic   Heme/Onc history: Patient is a 81 year old female referred for new diagnosis of DCIS.  She has not had any prior abnormal breast biopsy.  She she is G0 P0 L0.  Most recent mammogram in September 2021 showed an indeterminate left breast mass in the 3 o'clock position.  No left axillary adenopathy.  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    Final pathology showed 11 mm grade 2 DCIS with negative margins within a fibroadenoma.  pTis P NX ER greater than 90% positive  Patient started taking Arimidex in October 2021   Interval  history-tolerating Arimidex well.  Has an occasional hot flash.  Reports some restless leg at bedtime but feels is unrelated.  Continues to stay active and works out 3 days/week.  Taking calcium and vitamin D.  Started on a new medication for her Parkinson's called Rasagifine and is tolerating well.  Feels her energy levels have improved.  No new lumps or bumps.  ECOG PS- 1 Pain scale- 0   Review of systems- Review of Systems  Constitutional: Negative.  Negative for chills, fever, malaise/fatigue and weight loss.  HENT:  Negative for congestion, ear pain and tinnitus.   Eyes: Negative.  Negative for blurred vision and double vision.  Respiratory: Negative.  Negative for cough, sputum production and shortness of breath.   Cardiovascular: Negative.  Negative for chest pain, palpitations and leg swelling.  Gastrointestinal: Negative.  Negative for abdominal pain, constipation, diarrhea, nausea and vomiting.  Genitourinary:  Negative for dysuria, frequency and urgency.  Musculoskeletal:  Negative for back pain and falls.       Restless leg at bedtime  Skin: Negative.  Negative for rash.  Neurological:  Negative for sensory change, weakness and headaches.  Endo/Heme/Allergies: Negative.  Does not bruise/bleed easily.       Occasional hot flash  Psychiatric/Behavioral: Negative.  Negative for depression. The patient is not nervous/anxious and does not have insomnia.       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, clear margins   Heart disease    Hypertension  Leaky heart valve    Parkinson disease ([place])    Personal history of radiation therapy    2021, left breast ca   Ruptured disc, thoracic    Tremor      Past Surgical History:  Procedure Laterality Date   ABDOMINAL HYSTERECTOMY     complete   APPENDECTOMY     at the same time of hysterectomy   BASAL CELL  CARCINOMA EXCISION     nose   BREAST BIOPSY Left 11/30/2019   [place] bx, DCIS   BREAST LUMPECTOMY Left 12/29/2019   Procedure: BREAST LUMPECTOMY;  Surgeon: Robert Bellow, MD;  Location: [facility] ORS;  Service: General;  Laterality: Left;   CHOLECYSTECTOMY     EYE SURGERY  2021   TONSILLECTOMY      Social History   Socioeconomic History   Marital status: Widowed    Spouse name: Not on file   Number of children: 3   Years of education: one year college   Highest education level: Not on file  Occupational History   Occupation: Retired  Tobacco Use   Smoking status: Never   Smokeless tobacco: Never  Vaping Use   Vaping Use: Never used  Substance and Sexual Activity   Alcohol use: Not Currently   Drug use: Never   Sexual activity: Not Currently  Other Topics Concern   Not on file  Social History Narrative   Lives at home alone.   Right-handed.   2 cups caffeine daily.   Social Determinants of Health   Financial Resource Strain: Not on file  Food Insecurity: Not on file  Transportation Needs: Not on file  Physical Activity: Not on file  Stress: Not on file  Social Connections: Not on file  Intimate Partner Violence: Not on file    Family History  Problem Relation Age of Onset   COPD Mother    Heart disease Father    Diabetes Paternal Grandmother    Breast cancer Neg Hx      Current Outpatient Medications:    anastrozole (ARIMIDEX) 1 MG tablet, TAKE 1 TABLET BY MOUTH EVERY DAY, Disp: 90 tablet, Rfl: 1   aspirin EC 81 MG tablet, Take 81 mg by mouth daily., Disp: , Rfl:    Calcium Carb-Cholecalciferol (CALCIUM + D3 PO), Take 1 tablet by mouth in the morning and at bedtime., Disp: , Rfl:    carbidopa-levodopa (SINEMET IR) 25-100 MG tablet, Take 1 tablet by mouth 3 (three) times daily., Disp: 270 tablet, Rfl: 4   cholecalciferol (VITAMIN D3) 25 MCG (1000 UNIT) tablet, Take 1,000 Units by mouth daily., Disp: , Rfl:    Coenzyme Q10 (CO Q 10 PO), Take 200 mg by mouth  daily. , Disp: , Rfl:    lisinopril (PRINIVIL,ZESTRIL) 5 MG tablet, Take 5 mg by mouth daily., Disp: , Rfl:    Multiple Vitamin (MULTIVITAMIN) tablet, Take 1 tablet by mouth daily., Disp: , Rfl:    rasagiline (AZILECT) 1 MG TABS tablet, Take 1 tablet (1 mg total) by mouth daily., Disp: 30 tablet, Rfl: 11   simvastatin (ZOCOR) 20 MG tablet, Take 20 mg by mouth at bedtime. , Disp: , Rfl:    torsemide (DEMADEX) 20 MG tablet, Take 20 mg by mouth daily., Disp: , Rfl:    UNABLE TO FIND, Take 3 capsules by mouth daily. Med Name: Balance of Petra Kuba Supplement, Disp: , Rfl:   Physical exam:  There were no vitals filed for this visit.  Physical Exam Constitutional:  Appearance: Normal appearance.  Pulmonary:     Effort: Pulmonary effort is normal.  Neurological:     Mental Status: She is alert and oriented to person, place, and time.   CMP Latest Ref Rng & Units 05/02/2020  Glucose 70 - 99 mg/dL 88  BUN 8 - 23 mg/dL 16  Creatinine 0.44 - 1.00 mg/dL 0.83  Sodium 135 - 145 mmol/L 139  Potassium 3.5 - 5.1 mmol/L 4.0  Chloride 98 - 111 mmol/L 101  CO2 22 - 32 mmol/L 26  Calcium 8.9 - 10.3 mg/dL 9.1  Total Protein 6.5 - 8.1 g/dL 6.8  Total Bilirubin 0.3 - 1.2 mg/dL 0.7  Alkaline Phos 38 - 126 U/L 49  AST 15 - 41 U/L 20  ALT 0 - 44 U/L 8   CBC Latest Ref Rng & Units 05/02/2020  WBC 4.0 - 10.5 K/uL 6.5  Hemoglobin 12.0 - 15.0 g/dL 12.2  Hematocrit 36.0 - 46.0 % 37.2  Platelets 150 - 400 K/uL 255     Assessment and plan- Patient is a 81 y.o. 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  Denies any new lumps or bumps.  Mammogram from 11/15/20 was negative. Repeat in 1 year. Bone density from 02/09/20 was -2.0.  Continue weightbearing exercises.  Repeat bone density in November 2023.  Return to clinic in 6 months for in person follow-up with Dr. Janese Banks.  I provided 15 minutes of face-to-face video visit time during this  encounter, and > 50% was spent counseling as documented under my assessment & plan.    Visit Diagnosis 1. Ductal carcinoma in situ (DCIS) of left breast   2. Osteopenia of right hip    Faythe Casa, NP 03/26/2021 2:51 PM

## 2021-03-29 ENCOUNTER — Other Ambulatory Visit: Payer: Self-pay | Admitting: *Deleted

## 2021-03-29 ENCOUNTER — Encounter: Payer: Self-pay | Admitting: Neurology

## 2021-03-29 MED ORDER — RASAGILINE MESYLATE 1 MG PO TABS: 1.0000 mg | ORAL_TABLET | Freq: Every day | ORAL | 3 refills | Status: DC

## 2021-04-09 ENCOUNTER — Ambulatory Visit: Payer: Medicare HMO | Admitting: Radiation Oncology

## 2021-04-30 ENCOUNTER — Ambulatory Visit: Payer: Medicare HMO | Admitting: Radiation Oncology

## 2021-05-07 ENCOUNTER — Other Ambulatory Visit: Payer: Self-pay

## 2021-05-07 ENCOUNTER — Ambulatory Visit
Admission: RE | Admit: 2021-05-07 | Discharge: 2021-05-07 | Disposition: A | Discharge status: Home / Self Care | Payer: Medicare HMO | Source: Ambulatory Visit | Attending: Radiation Oncology | Admitting: Radiation Oncology

## 2021-05-07 VITALS — BP 108/62 | HR 70 | Resp 18 | Ht 61.0 in | Wt 178.0 lb

## 2021-05-07 DIAGNOSIS — Z923 Personal history of irradiation: Secondary | ICD-10-CM | POA: Diagnosis not present

## 2021-05-07 DIAGNOSIS — Z170 Estrogen receptor positive status [ER+]: Secondary | ICD-10-CM | POA: Diagnosis not present

## 2021-05-07 DIAGNOSIS — D0512 Intraductal carcinoma in situ of left breast: Secondary | ICD-10-CM | POA: Diagnosis not present

## 2021-05-07 DIAGNOSIS — C50412 Malignant neoplasm of upper-outer quadrant of left female breast: Secondary | ICD-10-CM

## 2021-05-07 DIAGNOSIS — Z79811 Long term (current) use of aromatase inhibitors: Secondary | ICD-10-CM | POA: Insufficient documentation

## 2021-05-07 DIAGNOSIS — Z86000 Personal history of in-situ neoplasm of breast: Secondary | ICD-10-CM | POA: Diagnosis not present

## 2021-05-07 DIAGNOSIS — Z08 Encounter for follow-up examination after completed treatment for malignant neoplasm: Secondary | ICD-10-CM | POA: Diagnosis not present

## 2021-05-07 NOTE — Progress Notes (Signed)
Radiation Oncology Follow up Note  Name: Sheila Mitchell   Date:   05/07/2021 MRN:  [id number] DOB: 1941/03/01    This 81 y.o. female presents to the clinic today for 1 year follow-up status post whole breast radiation to her left breast for ER positive ductal carcinoma in situ.  REFERRING PROVIDER: Danelle Berry, NP  HPI: Patient is a 81 year old female now out 1 year having a pleated whole breast radiation to her left breast for ER positive ductal carcinoma in situ.  Seen today in routine follow-up she is doing well specifically denies breast tenderness cough or bone pain.  She had mammograms back in August which I have reviewed were BI-RADS 2 benign.  She is currently on Arimidex tolerating it well without side effect.  COMPLICATIONS OF TREATMENT: none  FOLLOW UP COMPLIANCE: keeps appointments   PHYSICAL EXAM:  BP 108/62    Pulse 70    Resp 18    Ht 5\' 1"  (1.549 m)    Wt 178 lb (80.7 kg)    BMI 33.63 kg/m  Lungs are clear to A&P cardiac examination essentially unremarkable with regular rate and rhythm. No dominant mass or nodularity is noted in either breast in 2 positions examined. Incision is well-healed. No axillary or supraclavicular adenopathy is appreciated. Cosmetic result is excellent.  Well-developed well-nourished patient in NAD. HEENT reveals PERLA, EOMI, discs not visualized.  Oral cavity is clear. No oral mucosal lesions are identified. Neck is clear without evidence of cervical or supraclavicular adenopathy. Lungs are clear to A&P. Cardiac examination is essentially unremarkable with regular rate and rhythm without murmur rub or thrill. Abdomen is benign with no organomegaly or masses noted. Motor sensory and DTR levels are equal and symmetric in the upper and lower extremities. Cranial nerves II through XII are grossly intact. Proprioception is intact. No peripheral adenopathy or edema is identified. No motor or sensory levels are noted. Crude visual fields are within normal  range.  RADIOLOGY RESULTS: Mammograms reviewed compatible with above-stated findings  PLAN: Present time patient is doing well with no evidence of disease 1 year out and pleased with her overall progress.  Of asked to see her back in 1 year for follow-up.  She continues on Arimidex without side effect.  Patient is to call with any concerns.  I would like to take this opportunity to thank you for allowing me to participate in the care of your patient.Noreene Filbert, MD

## 2021-05-13 DIAGNOSIS — S0181XA Laceration without foreign body of other part of head, initial encounter: Secondary | ICD-10-CM | POA: Diagnosis not present

## 2021-05-13 DIAGNOSIS — S022XXA Fracture of nasal bones, initial encounter for closed fracture: Secondary | ICD-10-CM | POA: Diagnosis not present

## 2021-05-13 DIAGNOSIS — Z23 Encounter for immunization: Secondary | ICD-10-CM | POA: Diagnosis not present

## 2021-05-13 DIAGNOSIS — G20 Parkinson's disease: Secondary | ICD-10-CM | POA: Diagnosis not present

## 2021-05-13 DIAGNOSIS — S0101XA Laceration without foreign body of scalp, initial encounter: Secondary | ICD-10-CM | POA: Diagnosis not present

## 2021-05-13 DIAGNOSIS — Z7982 Long term (current) use of aspirin: Secondary | ICD-10-CM | POA: Diagnosis not present

## 2021-05-13 DIAGNOSIS — I10 Essential (primary) hypertension: Secondary | ICD-10-CM | POA: Diagnosis not present

## 2021-05-13 DIAGNOSIS — S0993XA Unspecified injury of face, initial encounter: Secondary | ICD-10-CM | POA: Diagnosis not present

## 2021-05-13 DIAGNOSIS — W010XXA Fall on same level from slipping, tripping and stumbling without subsequent striking against object, initial encounter: Secondary | ICD-10-CM | POA: Diagnosis not present

## 2021-06-01 ENCOUNTER — Encounter: Payer: Self-pay | Admitting: Neurology

## 2021-06-29 DIAGNOSIS — R5383 Other fatigue: Secondary | ICD-10-CM | POA: Diagnosis not present

## 2021-06-29 DIAGNOSIS — I10 Essential (primary) hypertension: Secondary | ICD-10-CM | POA: Diagnosis not present

## 2021-06-29 DIAGNOSIS — E785 Hyperlipidemia, unspecified: Secondary | ICD-10-CM | POA: Diagnosis not present

## 2021-06-29 DIAGNOSIS — R7301 Impaired fasting glucose: Secondary | ICD-10-CM | POA: Diagnosis not present

## 2021-07-04 DIAGNOSIS — E782 Mixed hyperlipidemia: Secondary | ICD-10-CM | POA: Diagnosis not present

## 2021-07-04 DIAGNOSIS — G20 Parkinson's disease: Secondary | ICD-10-CM | POA: Diagnosis not present

## 2021-07-04 DIAGNOSIS — I10 Essential (primary) hypertension: Secondary | ICD-10-CM | POA: Diagnosis not present

## 2021-07-05 DIAGNOSIS — E782 Mixed hyperlipidemia: Secondary | ICD-10-CM | POA: Diagnosis not present

## 2021-07-05 DIAGNOSIS — R0602 Shortness of breath: Secondary | ICD-10-CM | POA: Diagnosis not present

## 2021-07-05 DIAGNOSIS — I340 Nonrheumatic mitral (valve) insufficiency: Secondary | ICD-10-CM | POA: Diagnosis not present

## 2021-07-05 DIAGNOSIS — I10 Essential (primary) hypertension: Secondary | ICD-10-CM | POA: Diagnosis not present

## 2021-07-05 DIAGNOSIS — I351 Nonrheumatic aortic (valve) insufficiency: Secondary | ICD-10-CM | POA: Diagnosis not present

## 2021-07-05 DIAGNOSIS — I2510 Atherosclerotic heart disease of native coronary artery without angina pectoris: Secondary | ICD-10-CM | POA: Diagnosis not present

## 2021-08-15 DIAGNOSIS — E782 Mixed hyperlipidemia: Secondary | ICD-10-CM | POA: Diagnosis not present

## 2021-08-15 DIAGNOSIS — I10 Essential (primary) hypertension: Secondary | ICD-10-CM | POA: Diagnosis not present

## 2021-08-15 DIAGNOSIS — G20 Parkinson's disease: Secondary | ICD-10-CM | POA: Diagnosis not present

## 2021-09-05 ENCOUNTER — Ambulatory Visit: Payer: Medicare HMO | Admitting: Neurology

## 2021-09-05 ENCOUNTER — Encounter: Payer: Self-pay | Admitting: Neurology

## 2021-09-05 VITALS — BP 130/84 | HR 74 | Ht 61.0 in | Wt 173.0 lb

## 2021-09-05 DIAGNOSIS — G20 Parkinson's disease: Secondary | ICD-10-CM

## 2021-09-05 MED ORDER — CARBIDOPA-LEVODOPA 25-100 MG PO TABS: 1.0000 | ORAL_TABLET | Freq: Three times a day (TID) | ORAL | 4 refills | Status: DC

## 2021-09-05 NOTE — Patient Instructions (Signed)
Continue current medications Continue your boxing  See you back in 6 months

## 2021-09-05 NOTE — Progress Notes (Signed)
PATIENT: Sheila Mitchell DOB: Apr 09, 1940  REASON FOR VISIT: follow up for Parkinson's Disease HISTORY FROM: patient PRIMARY NEUROLOGIST: Dr.Yan   HISTORY   Sheila Mitchell is a 81 years old female, seen in request by her primary care NP Evern Bio for evaluation of Parkinson's disease.  Initial evaluation was on April 06, 2018    She has past medical history of hypertension, hyperlipidemia.   Patient reported a history of right hand tremor since summer 2019, smaller print with prolonged writing, she also noticed mild gait difficulty, difficulty initiate gait when first get up, unsteady gait, dragging her right leg across the floor, initially attributed to her hip, low back problem, had physical therapy which was helpful, also reported MRI of lumbar and thoracic spine by [facility] orthopedic surgeon reviewed degenerative disc disease, is planning on to have epidural injection.   She had decreased sense of smell for more than 10 years, denied constipation, occasionally orthostatic dizziness, tends to restless when sleep,   MRI of the brain in February 2020, showed no significant abnormalities, symptoms of PD include: tremor of right hand, gait difficulty, decreased sense of smell, restless sleeping    She joined a Parkinson's disease exercise group, she goes 3 times a week to do boxing, exercise.  She has been lifting weights.    UPDATE July 08 2019: She complains of increased gait abnormality, freeze difficulty moving her left leg, increased left hand tremor,  Update March 02, 2020 SS: When last seen, Sinemet 25/100 mg 3 times daily was started, referred to physical therapy due to worsening symptoms, gait abnormality.  Currently taking Sinemet 9:30 am, 3:45 pm, 10:45 pm, notes wearing off effect, feeling tired around 2 and 3 afternoon, does her exercise at 4:00 is hard for her.  Just finished radiation for breast cancer, has started oral chemo Anastrozole. Hopes to get back  to her boxing, once cleared by oncologist.  Has noted big improvement with Sinemet, more confident in walking, less tremor to left hand, less fatigue.  No falls.  Update August 31, 2020 SS: Back to boxing 3 times a week, left hip bursitis got a steroid shot is better, did some PT for low back pain, has been really good overall, 5 more sessions. PD feels stable. Taking Sinemet 25-100 mg 3 times daily 9:30 AM, 2:30, 7:30 PM, sees oncologist for breast cancer history coming up, remains on Arimidex. No problems getting out of chair. Hardly every tremor in left hand.   UPDATE Feb 26 2021: Dr. Krista Blue  She is overall doing very well, taking Sinemet 25/100 mg twice daily, also exercises regularly, participate in rock boxing  Update September 04, 2021 SS: Doing great today, on Sinemet 25/100, 1 tablets 3 times daily, 9:30, 2:30, 7:30. Dr. Krista Blue added Azilect last visit, no issues or change, does cost $40/month this is ok. Does rock steady boxing 3 times a week, got new sneakers today. Feels smell and handwriting has improved.  Had a fall back in February visiting her sister in [place], had to get stitches to left eyebrow, tripped down stairs navigating her luggage.   REVIEW OF SYSTEMS: Out of a complete 14 system review of symptoms, the patient complains only of the following symptoms, and all other reviewed systems are negative.  See HPI  ALLERGIES: No Known Allergies  HOME MEDICATIONS: Outpatient Medications Prior to Visit  Medication Sig Dispense Refill   anastrozole (ARIMIDEX) 1 MG tablet TAKE 1 TABLET BY MOUTH EVERY DAY 90 tablet 1  aspirin EC 81 MG tablet Take 81 mg by mouth daily.     Calcium Carb-Cholecalciferol (CALCIUM + D3 PO) Take 1 tablet by mouth in the morning and at bedtime. Calcium 750 mg with D3 12.5 mg - 500 UI     cholecalciferol (VITAMIN D3) 25 MCG (1000 UNIT) tablet Take 1,000 Units by mouth daily.     Coenzyme Q10 (CO Q 10 PO) Take 200 mg by mouth daily.      lisinopril (PRINIVIL,ZESTRIL) 5 MG  tablet Take 5 mg by mouth daily.     Multiple Vitamins-Minerals (CENTRUM ADULT PO) Take by mouth.     Omega-3 Fatty Acids (FISH OIL) 1200 MG CAPS Take 1 capsule by mouth daily.     rasagiline (AZILECT) 1 MG TABS tablet Take 1 tablet (1 mg total) by mouth daily. 90 tablet 3   simvastatin (ZOCOR) 20 MG tablet Take 20 mg by mouth at bedtime.      torsemide (DEMADEX) 20 MG tablet Take 20 mg by mouth daily.     UNABLE TO FIND Take 3 capsules by mouth daily. Med Name: Balance of Petra Kuba Supplement     carbidopa-levodopa (SINEMET IR) 25-100 MG tablet Take 1 tablet by mouth 3 (three) times daily. 270 tablet 4   Multiple Vitamin (MULTIVITAMIN) tablet Take 1 tablet by mouth daily.     No facility-administered medications prior to visit.    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, clear margins   Heart disease    Hypertension    Leaky heart valve    Parkinson disease ([place])    Personal history of radiation therapy    2021, left breast ca   Ruptured disc, thoracic    Tremor     PAST SURGICAL HISTORY: Past Surgical History:  Procedure Laterality Date   ABDOMINAL HYSTERECTOMY     complete   APPENDECTOMY     at the same time of hysterectomy   BASAL CELL CARCINOMA EXCISION     nose   BREAST BIOPSY Left 11/30/2019   [place] bx, DCIS   BREAST LUMPECTOMY Left 12/29/2019   Procedure: BREAST LUMPECTOMY;  Surgeon: Robert Bellow, MD;  Location: [facility] ORS;  Service: General;  Laterality: Left;   CHOLECYSTECTOMY     EYE SURGERY  2021   TONSILLECTOMY      FAMILY HISTORY: Family History  Problem Relation Age of Onset   COPD Mother    Heart disease Father    Diabetes Paternal Grandmother    Breast cancer Neg Hx     SOCIAL HISTORY: Social History   Socioeconomic History   Marital status: Widowed    Spouse name: Not on file   Number of children: 3   Years of education:  one year college   Highest education level: Not on file  Occupational History   Occupation: Retired  Tobacco Use   Smoking status: Never   Smokeless tobacco: Never  Vaping Use   Vaping Use: Never used  Substance and Sexual Activity   Alcohol use: Not Currently   Drug use: Never   Sexual activity: Not Currently  Other Topics Concern   Not on file  Social History Narrative   Lives at home alone.   Right-handed.   2 cups caffeine daily.   Social Determinants of Health   Financial Resource Strain: Not on file  Food Insecurity: Not on file  Transportation Needs: Not on file  Physical Activity: Not on file  Stress: Not on file  Social Connections: Not on file  Intimate Partner Violence: Not on file   PHYSICAL EXAM  Vitals:   09/05/21 1325  BP: 130/84  Pulse: 74  Weight: 173 lb (78.5 kg)  Height: '5\' 1"'$  (1.549 m)   Body mass index is 32.69 kg/m.  Generalized: Well developed, in no acute distress   Neurological examination  Mentation: Alert oriented to time, place, history taking. Follows all commands speech and language fluent Cranial nerve II-XII: Pupils were equal round reactive to light. Extraocular movements were full, visual field were full on confrontational test. Facial sensation and strength were normal. Head turning and shoulder shrug  were normal and symmetric. Motor: The motor testing reveals 5 over 5 strength of all 4 extremities. Mild resting tremor left hand noted.  No significant bradykinesia, slight increased rigidity to left upper extremity. Sensory: Sensory testing is intact to soft touch on all 4 extremities. No evidence of extinction is noted.  Coordination: Cerebellar testing reveals good finger-nose-finger and heel-to-shin bilaterally.  Gait and station: Able to stand from seated position with arms crossed, good arm swing, turns, steady Reflexes: Deep tendon reflexes are symmetric and normal bilaterally.   DIAGNOSTIC DATA (LABS, IMAGING, TESTING) -  I reviewed patient records, labs, notes, testing and imaging myself where available.  Lab Results  Component Value Date   WBC 6.5 05/02/2020   HGB 12.2 05/02/2020   HCT 37.2 05/02/2020   MCV 91.4 05/02/2020   PLT 255 05/02/2020      Component Value Date/Time   NA 139 05/02/2020 1044   K 4.0 05/02/2020 1044   CL 101 05/02/2020 1044   CO2 26 05/02/2020 1044   GLUCOSE 88 05/02/2020 1044   BUN 16 05/02/2020 1044   CREATININE 0.83 05/02/2020 1044   CALCIUM 9.1 05/02/2020 1044   PROT 6.8 05/02/2020 1044   ALBUMIN 3.6 05/02/2020 1044   AST 20 05/02/2020 1044   ALT 8 05/02/2020 1044   ALKPHOS 49 05/02/2020 1044   BILITOT 0.7 05/02/2020 1044   GFRNONAA >60 05/02/2020 1044   No results found for: "CHOL", "HDL", "LDLCALC", "LDLDIRECT", "TRIG", "CHOLHDL" No results found for: "HGBA1C" No results found for: "VITAMINB12" No results found for: "TSH"  ASSESSMENT AND PLAN 81 y.o. year old female   1.  Idiopathic Parkinson's disease  -Ms. Cloe is doing very well, commended on her dedication to rock steady boxing, encouraged her to continue -Continue Sinemet 25/100 mg 3 times daily, Azilect 1 mg daily -Follow-up in 6 months or sooner if needed  Evangeline Dakin, DNP 09/05/2021, 1:49 PM [facility] [address], [place], [place] [address] [phone]

## 2021-09-06 DIAGNOSIS — H524 Presbyopia: Secondary | ICD-10-CM | POA: Diagnosis not present

## 2021-09-06 DIAGNOSIS — Z0100 Encounter for examination of eyes and vision without abnormal findings: Secondary | ICD-10-CM | POA: Diagnosis not present

## 2021-09-26 ENCOUNTER — Encounter: Payer: Self-pay | Admitting: Oncology

## 2021-09-26 ENCOUNTER — Inpatient Hospital Stay: Payer: Medicare HMO | Attending: Oncology | Admitting: Oncology

## 2021-09-26 VITALS — BP 113/75 | HR 80 | Resp 18 | Wt 174.1 lb

## 2021-09-26 DIAGNOSIS — M85851 Other specified disorders of bone density and structure, right thigh: Secondary | ICD-10-CM | POA: Insufficient documentation

## 2021-09-26 DIAGNOSIS — Z923 Personal history of irradiation: Secondary | ICD-10-CM | POA: Diagnosis not present

## 2021-09-26 DIAGNOSIS — Z79811 Long term (current) use of aromatase inhibitors: Secondary | ICD-10-CM | POA: Insufficient documentation

## 2021-09-26 DIAGNOSIS — Z79899 Other long term (current) drug therapy: Secondary | ICD-10-CM | POA: Insufficient documentation

## 2021-09-26 DIAGNOSIS — Z170 Estrogen receptor positive status [ER+]: Secondary | ICD-10-CM | POA: Diagnosis not present

## 2021-09-26 DIAGNOSIS — Z85828 Personal history of other malignant neoplasm of skin: Secondary | ICD-10-CM | POA: Insufficient documentation

## 2021-09-26 DIAGNOSIS — D0512 Intraductal carcinoma in situ of left breast: Secondary | ICD-10-CM | POA: Insufficient documentation

## 2021-09-26 DIAGNOSIS — G20 Parkinson's disease: Secondary | ICD-10-CM | POA: Diagnosis not present

## 2021-09-26 DIAGNOSIS — Z7982 Long term (current) use of aspirin: Secondary | ICD-10-CM | POA: Insufficient documentation

## 2021-09-26 DIAGNOSIS — I10 Essential (primary) hypertension: Secondary | ICD-10-CM | POA: Insufficient documentation

## 2021-09-26 DIAGNOSIS — Z5181 Encounter for therapeutic drug level monitoring: Secondary | ICD-10-CM | POA: Diagnosis not present

## 2021-09-26 NOTE — Progress Notes (Signed)
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  She has not had any prior abnormal breast biopsy.  She she is G0 P0 L0.  Most recent mammogram in September 2021 showed an indeterminate left breast mass in the 3 o'clock position.  No left axillary adenopathy.  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    Final pathology showed 11 mm grade 2 DCIS with negative margins within a fibroadenoma.  pTis P NX ER greater than 90% positive   Patient started taking Arimidex in October 2021    Interval history-patient is presently doing well and denies any significant complaints at this time.  Tolerating Arimidex well without any significant side effects  ECOG PS- 1 Pain scale- 0   Review of systems- Review of Systems  Constitutional:  Negative for chills, fever, malaise/fatigue and weight loss.  HENT:  Negative for congestion, ear discharge and nosebleeds.   Eyes:  Negative for blurred vision.  Respiratory:  Negative for cough, hemoptysis, sputum production, shortness of breath and wheezing.   Cardiovascular:  Negative for chest pain, palpitations, orthopnea and claudication.  Gastrointestinal:  Negative for abdominal pain, blood in stool, constipation,  diarrhea, heartburn, melena, nausea and vomiting.  Genitourinary:  Negative for dysuria, flank pain, frequency, hematuria and urgency.  Musculoskeletal:  Negative for back pain, joint pain and myalgias.  Skin:  Negative for rash.  Neurological:  Negative for dizziness, tingling, focal weakness, seizures, weakness and headaches.  Endo/Heme/Allergies:  Does not bruise/bleed easily.  Psychiatric/Behavioral:  Negative for depression and suicidal ideas. The patient does not have insomnia.       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, clear margins   Heart disease    Hypertension    Leaky heart valve    Parkinson disease ([place])    Personal history of radiation therapy    2021, left breast ca   Ruptured disc, thoracic    Tremor      Past Surgical History:  Procedure Laterality Date   ABDOMINAL HYSTERECTOMY     complete   APPENDECTOMY     at the same time of hysterectomy   BASAL CELL CARCINOMA EXCISION     nose   BREAST BIOPSY Left 11/30/2019   [place] bx, DCIS   BREAST LUMPECTOMY Left 12/29/2019   Procedure: BREAST LUMPECTOMY;  Surgeon: Robert Bellow, MD;  Location: [facility] ORS;  Service: General;  Laterality: Left;   CHOLECYSTECTOMY     EYE SURGERY  2021   TONSILLECTOMY      Social History   Socioeconomic History   Marital status: Widowed    Spouse name: Not on file   Number of children: 3   Years  of education: one year college   Highest education level: Not on file  Occupational History   Occupation: Retired  Tobacco Use   Smoking status: Never   Smokeless tobacco: Never  Vaping Use   Vaping Use: Never used  Substance and Sexual Activity   Alcohol use: Not Currently   Drug use: Never   Sexual activity: Not Currently  Other Topics Concern   Not on file  Social History Narrative   Lives at home alone.   Right-handed.   2 cups caffeine daily.    Social Determinants of Health   Financial Resource Strain: Not on file  Food Insecurity: Not on file  Transportation Needs: Not on file  Physical Activity: Not on file  Stress: Not on file  Social Connections: Not on file  Intimate Partner Violence: Not on file    Family History  Problem Relation Age of Onset   COPD Mother    Heart disease Father    Diabetes Paternal Grandmother    Breast cancer Neg Hx      Current Outpatient Medications:    anastrozole (ARIMIDEX) 1 MG tablet, TAKE 1 TABLET BY MOUTH EVERY DAY, Disp: 90 tablet, Rfl: 1   aspirin EC 81 MG tablet, Take 81 mg by mouth daily., Disp: , Rfl:    Calcium Carb-Cholecalciferol (CALCIUM + D3 PO), Take 1 tablet by mouth in the morning and at bedtime. Calcium 750 mg with D3 12.5 mg - 500 UI, Disp: , Rfl:    carbidopa-levodopa (SINEMET IR) 25-100 MG tablet, Take 1 tablet by mouth 3 (three) times daily., Disp: 270 tablet, Rfl: 4   cholecalciferol (VITAMIN D3) 25 MCG (1000 UNIT) tablet, Take 1,000 Units by mouth daily., Disp: , Rfl:    Coenzyme Q10 (CO Q 10 PO), Take 200 mg by mouth daily. , Disp: , Rfl:    lisinopril (PRINIVIL,ZESTRIL) 5 MG tablet, Take 5 mg by mouth daily., Disp: , Rfl:    Multiple Vitamins-Minerals (CENTRUM ADULT PO), Take by mouth., Disp: , Rfl:    Omega-3 Fatty Acids (FISH OIL) 1200 MG CAPS, Take 1 capsule by mouth daily., Disp: , Rfl:    rasagiline (AZILECT) 1 MG TABS tablet, Take 1 tablet (1 mg total) by mouth daily., Disp: 90 tablet, Rfl: 3   simvastatin (ZOCOR) 20 MG tablet, Take 20 mg by mouth at bedtime. , Disp: , Rfl:    torsemide (DEMADEX) 20 MG tablet, Take 20 mg by mouth daily., Disp: , Rfl:    UNABLE TO FIND, Take 3 capsules by mouth daily. Med Name: Balance of Petra Kuba Supplement, Disp: , Rfl:   Physical exam:  Vitals:   09/26/21 1408  BP: 113/75  Pulse: 80  Resp: 18  SpO2: 97%  Weight: 174 lb 1.6 oz (79 kg)   Physical Exam Constitutional:      General: She is not in acute  distress. Cardiovascular:     Rate and Rhythm: Normal rate and regular rhythm.     Heart sounds: Normal heart sounds.  Pulmonary:     Effort: Pulmonary effort is normal.     Breath sounds: Normal breath sounds.  Skin:    General: Skin is warm and dry.  Neurological:     Mental Status: She is alert and oriented to person, place, and time.    Breast exam was performed in seated and lying down position. Patient is status post left lumpectomy with a well-healed surgical scar. No evidence of any palpable masses. No evidence of axillary adenopathy. No  evidence of any palpable masses or lumps in the right breast. No evidence of right axillary adenopathy      Latest Ref Rng & Units 05/02/2020   10:44 AM  CMP  Glucose 70 - 99 mg/dL 88   BUN 8 - 23 mg/dL 16   Creatinine 0.44 - 1.00 mg/dL 0.83   Sodium 135 - 145 mmol/L 139   Potassium 3.5 - 5.1 mmol/L 4.0   Chloride 98 - 111 mmol/L 101   CO2 22 - 32 mmol/L 26   Calcium 8.9 - 10.3 mg/dL 9.1   Total Protein 6.5 - 8.1 g/dL 6.8   Total Bilirubin 0.3 - 1.2 mg/dL 0.7   Alkaline Phos 38 - 126 U/L 49   AST 15 - 41 U/L 20   ALT 0 - 44 U/L 8       Latest Ref Rng & Units 05/02/2020   10:44 AM  CBC  WBC 4.0 - 10.5 K/uL 6.5   Hemoglobin 12.0 - 15.0 g/dL 12.2   Hematocrit 36.0 - 46.0 % 37.2   Platelets 150 - 400 K/uL 255      Assessment and plan- Patient is a 81 y.o. 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  She will be due for a mammogram next month which will be coordinated by Dr. Bary Castilla.  I will see her back in 6 months no labs with a bone density scan prior.   Visit Diagnosis 1. Ductal carcinoma in situ (DCIS) of left breast   2. Osteopenia of right hip   3. Visit for monitoring Arimidex therapy      Dr. Randa Evens, MD, MPH [facility] [phone] 09/26/2021 7:54 PM

## 2021-10-11 ENCOUNTER — Other Ambulatory Visit: Payer: Self-pay | Admitting: *Deleted

## 2021-10-11 MED ORDER — ANASTROZOLE 1 MG PO TABS: 1.0000 mg | ORAL_TABLET | Freq: Every day | ORAL | 1 refills | Status: DC

## 2021-10-12 ENCOUNTER — Other Ambulatory Visit: Payer: Self-pay | Admitting: General Surgery

## 2021-10-12 DIAGNOSIS — D0512 Intraductal carcinoma in situ of left breast: Secondary | ICD-10-CM

## 2021-10-15 DIAGNOSIS — E782 Mixed hyperlipidemia: Secondary | ICD-10-CM | POA: Diagnosis not present

## 2021-10-15 DIAGNOSIS — G20 Parkinson's disease: Secondary | ICD-10-CM | POA: Diagnosis not present

## 2021-10-15 DIAGNOSIS — I10 Essential (primary) hypertension: Secondary | ICD-10-CM | POA: Diagnosis not present

## 2021-11-08 DIAGNOSIS — E782 Mixed hyperlipidemia: Secondary | ICD-10-CM | POA: Diagnosis not present

## 2021-11-08 DIAGNOSIS — I340 Nonrheumatic mitral (valve) insufficiency: Secondary | ICD-10-CM | POA: Diagnosis not present

## 2021-11-08 DIAGNOSIS — E669 Obesity, unspecified: Secondary | ICD-10-CM | POA: Diagnosis not present

## 2021-11-08 DIAGNOSIS — I2510 Atherosclerotic heart disease of native coronary artery without angina pectoris: Secondary | ICD-10-CM | POA: Diagnosis not present

## 2021-11-08 DIAGNOSIS — E119 Type 2 diabetes mellitus without complications: Secondary | ICD-10-CM | POA: Diagnosis not present

## 2021-11-08 DIAGNOSIS — I10 Essential (primary) hypertension: Secondary | ICD-10-CM | POA: Diagnosis not present

## 2021-11-08 DIAGNOSIS — I351 Nonrheumatic aortic (valve) insufficiency: Secondary | ICD-10-CM | POA: Diagnosis not present

## 2021-11-16 ENCOUNTER — Ambulatory Visit
Admission: RE | Admit: 2021-11-16 | Discharge: 2021-11-16 | Disposition: A | Discharge status: Home / Self Care | Payer: Medicare HMO | Source: Ambulatory Visit | Attending: General Surgery | Admitting: General Surgery

## 2021-11-16 DIAGNOSIS — D0512 Intraductal carcinoma in situ of left breast: Secondary | ICD-10-CM | POA: Diagnosis not present

## 2021-11-16 DIAGNOSIS — R921 Mammographic calcification found on diagnostic imaging of breast: Secondary | ICD-10-CM | POA: Diagnosis not present

## 2021-11-20 ENCOUNTER — Other Ambulatory Visit: Payer: Self-pay | Admitting: General Surgery

## 2021-11-20 DIAGNOSIS — R928 Other abnormal and inconclusive findings on diagnostic imaging of breast: Secondary | ICD-10-CM

## 2021-11-20 DIAGNOSIS — R921 Mammographic calcification found on diagnostic imaging of breast: Secondary | ICD-10-CM

## 2021-11-22 DIAGNOSIS — D0512 Intraductal carcinoma in situ of left breast: Secondary | ICD-10-CM | POA: Diagnosis not present

## 2021-12-05 ENCOUNTER — Ambulatory Visit
Admission: RE | Admit: 2021-12-05 | Discharge: 2021-12-05 | Disposition: A | Discharge status: Home / Self Care | Payer: Medicare HMO | Source: Ambulatory Visit | Attending: General Surgery | Admitting: General Surgery

## 2021-12-05 DIAGNOSIS — R928 Other abnormal and inconclusive findings on diagnostic imaging of breast: Secondary | ICD-10-CM | POA: Insufficient documentation

## 2021-12-05 DIAGNOSIS — R921 Mammographic calcification found on diagnostic imaging of breast: Secondary | ICD-10-CM | POA: Insufficient documentation

## 2021-12-05 HISTORY — PX: BREAST BIOPSY: SHX20

## 2021-12-06 LAB — SURGICAL PATHOLOGY

## 2021-12-20 DIAGNOSIS — D2272 Melanocytic nevi of left lower limb, including hip: Secondary | ICD-10-CM | POA: Diagnosis not present

## 2021-12-20 DIAGNOSIS — Z85828 Personal history of other malignant neoplasm of skin: Secondary | ICD-10-CM | POA: Diagnosis not present

## 2021-12-20 DIAGNOSIS — D2271 Melanocytic nevi of right lower limb, including hip: Secondary | ICD-10-CM | POA: Diagnosis not present

## 2021-12-20 DIAGNOSIS — L720 Epidermal cyst: Secondary | ICD-10-CM | POA: Diagnosis not present

## 2021-12-20 DIAGNOSIS — D2261 Melanocytic nevi of right upper limb, including shoulder: Secondary | ICD-10-CM | POA: Diagnosis not present

## 2022-01-07 DIAGNOSIS — E782 Mixed hyperlipidemia: Secondary | ICD-10-CM | POA: Diagnosis not present

## 2022-01-07 DIAGNOSIS — I10 Essential (primary) hypertension: Secondary | ICD-10-CM | POA: Diagnosis not present

## 2022-01-17 DIAGNOSIS — Z0001 Encounter for general adult medical examination with abnormal findings: Secondary | ICD-10-CM | POA: Diagnosis not present

## 2022-01-17 DIAGNOSIS — I10 Essential (primary) hypertension: Secondary | ICD-10-CM | POA: Diagnosis not present

## 2022-01-17 DIAGNOSIS — N751 Abscess of Bartholin's gland: Secondary | ICD-10-CM | POA: Diagnosis not present

## 2022-01-17 DIAGNOSIS — Z23 Encounter for immunization: Secondary | ICD-10-CM | POA: Diagnosis not present

## 2022-01-17 DIAGNOSIS — G20A2 Parkinson's disease without dyskinesia, with fluctuations: Secondary | ICD-10-CM | POA: Diagnosis not present

## 2022-02-19 ENCOUNTER — Other Ambulatory Visit: Payer: Medicare HMO

## 2022-03-09 ENCOUNTER — Other Ambulatory Visit: Payer: Self-pay | Admitting: Oncology

## 2022-03-20 NOTE — Progress Notes (Signed)
Virtual Visit via Video Note  I connected with Sheila Mitchell on 03/21/22 at  1:45 PM EST by a video enabled telemedicine application and verified that I am speaking with the correct person using two identifiers.  Location: Patient: at her home Provider: in the office    I discussed the limitations of evaluation and management by telemedicine and the availability of in person appointments. The patient expressed understanding and agreed to proceed.  History of Present Illness:   Sheila Mitchell is a 82 years old female, seen in request by her primary care NP Orson Eva for evaluation of Parkinson's disease.  Initial evaluation was on April 06, 2018    She has past medical history of hypertension, hyperlipidemia.   Patient reported a history of right hand tremor since summer 2019, smaller print with prolonged writing, she also noticed mild gait difficulty, difficulty initiate gait when first get up, unsteady gait, dragging her right leg across the floor, initially attributed to her hip, low back problem, had physical therapy which was helpful, also reported MRI of lumbar and thoracic spine by [facility] orthopedic surgeon reviewed degenerative disc disease, is planning on to have epidural injection.   She had decreased sense of smell for more than 10 years, denied constipation, occasionally orthostatic dizziness, tends to restless when sleep,   MRI of the brain in February 2020, showed no significant abnormalities, symptoms of PD include: tremor of right hand, gait difficulty, decreased sense of smell, restless sleeping    She joined a Parkinson's disease exercise group, she goes 3 times a week to do boxing, exercise.  She has been lifting weights.    UPDATE July 08 2019: She complains of increased gait abnormality, freeze difficulty moving her left leg, increased left hand tremor,  Update March 02, 2020 SS: When last seen, Sinemet 25/100 mg 3 times daily was started, referred  to physical therapy due to worsening symptoms, gait abnormality.  Currently taking Sinemet 9:30 am, 3:45 pm, 10:45 pm, notes wearing off effect, feeling tired around 2 and 3 afternoon, does her exercise at 4:00 is hard for her.  Just finished radiation for breast cancer, has started oral chemo Anastrozole. Hopes to get back to her boxing, once cleared by oncologist.  Has noted big improvement with Sinemet, more confident in walking, less tremor to left hand, less fatigue.  No falls.  Update August 31, 2020 SS: Back to boxing 3 times a week, left hip bursitis got a steroid shot is better, did some PT for low back pain, has been really good overall, 5 more sessions. PD feels stable. Taking Sinemet 25-100 mg 3 times daily 9:30 AM, 2:30, 7:30 PM, sees oncologist for breast cancer history coming up, remains on Arimidex. No problems getting out of chair. Hardly every tremor in left hand.   UPDATE Feb 26 2021: Dr. Terrace Arabia  She is overall doing very well, taking Sinemet 25/100 mg twice daily, also exercises regularly, participate in rock boxing  Update September 04, 2021 SS: Doing great today, on Sinemet 25/100, 1 tablets 3 times daily, 9:30, 2:30, 7:30. Dr. Terrace Arabia added Azilect last visit, no issues or change, does cost $40/month this is ok. Does rock steady boxing 3 times a week, got new sneakers today. Feels smell and handwriting has improved.  Had a fall back in February visiting her sister in [place], had to get stitches to left eyebrow, tripped down stairs navigating her luggage.   Update March 21, 2022 SS: Via [facility], Has seen  good improvement with her walking with her boxing  classes. Remains on sinemet 25/100 9:30 2:30 7:30. On Azilect. No freezing. No recent falls.  Very pleased.   Observations/Objective: Is alert and oriented, speech is clear and concise, moves about freely, very mild resting left hand tremor, gait has a normal stance, slight decrease arm swing with the left, good turns  Assessment and Plan: 1.   Idiopathic Parkinson's disease -Continues to do very well and remained stable, has superb compliance and commitment to rock steady boxing -Continue Sinemet 25/100 mg 3 times daily, Azilect 1 mg daily  Follow Up Instructions: 6 months   I discussed the assessment and treatment plan with the patient. The patient was provided an opportunity to ask questions and all were answered. The patient agreed with the plan and demonstrated an understanding of the instructions.   The patient was advised to call back or seek an in-person evaluation if the symptoms worsen or if the condition fails to improve as anticipated.  Otila Kluver, DNP  [facility] [address], [address] [place], [place] [address] [phone]

## 2022-03-21 ENCOUNTER — Encounter: Payer: Self-pay | Admitting: Neurology

## 2022-03-21 ENCOUNTER — Telehealth (INDEPENDENT_AMBULATORY_CARE_PROVIDER_SITE_OTHER): Payer: Medicare HMO | Admitting: Neurology

## 2022-03-21 ENCOUNTER — Encounter: Payer: Medicare HMO | Admitting: Physician Assistant

## 2022-03-21 DIAGNOSIS — G20A1 Parkinson's disease without dyskinesia, without mention of fluctuations: Secondary | ICD-10-CM | POA: Diagnosis not present

## 2022-03-21 MED ORDER — CARBIDOPA-LEVODOPA 25-100 MG PO TABS: 1.0000 | ORAL_TABLET | Freq: Three times a day (TID) | ORAL | 4 refills | Status: DC

## 2022-03-21 MED ORDER — RASAGILINE MESYLATE 1 MG PO TABS: 1.0000 mg | ORAL_TABLET | Freq: Every day | ORAL | 3 refills | Status: DC

## 2022-03-21 NOTE — Progress Notes (Signed)
Suspect patient scheduled in error. Provider waited for patient to log on, with no attempts made, for over 10 minutes. Patient did have a telehealth visit today scheduled with her Neurologist, so this is why I am assuming this appt was made in error. Will mark erroneous to eliminate duplicate.   This encounter was created in error - please disregard.

## 2022-03-21 NOTE — Patient Instructions (Signed)
Lovely to see you today! Continue current medications! Continue your superb commitment to boxing Follow-up in 6 months

## 2022-03-29 ENCOUNTER — Ambulatory Visit: Payer: Medicare HMO | Admitting: Oncology

## 2022-04-25 ENCOUNTER — Ambulatory Visit
Admission: RE | Admit: 2022-04-25 | Discharge: 2022-04-25 | Disposition: A | Discharge status: Home / Self Care | Payer: Medicare HMO | Source: Ambulatory Visit | Attending: Oncology | Admitting: Oncology

## 2022-04-25 DIAGNOSIS — Z780 Asymptomatic menopausal state: Secondary | ICD-10-CM | POA: Diagnosis not present

## 2022-04-25 DIAGNOSIS — D0512 Intraductal carcinoma in situ of left breast: Secondary | ICD-10-CM

## 2022-04-25 DIAGNOSIS — M85851 Other specified disorders of bone density and structure, right thigh: Secondary | ICD-10-CM

## 2022-04-28 ENCOUNTER — Other Ambulatory Visit: Payer: Self-pay | Admitting: Nurse Practitioner

## 2022-04-29 ENCOUNTER — Encounter: Payer: Self-pay | Admitting: Oncology

## 2022-04-29 ENCOUNTER — Inpatient Hospital Stay: Payer: Medicare HMO | Attending: Oncology | Admitting: Oncology

## 2022-04-29 VITALS — BP 110/71 | HR 79 | Temp 97.8°F | Resp 19 | Wt 160.4 lb

## 2022-04-29 DIAGNOSIS — M85852 Other specified disorders of bone density and structure, left thigh: Secondary | ICD-10-CM

## 2022-04-29 DIAGNOSIS — Z170 Estrogen receptor positive status [ER+]: Secondary | ICD-10-CM | POA: Diagnosis not present

## 2022-04-29 DIAGNOSIS — M8589 Other specified disorders of bone density and structure, multiple sites: Secondary | ICD-10-CM | POA: Diagnosis not present

## 2022-04-29 DIAGNOSIS — Z79811 Long term (current) use of aromatase inhibitors: Secondary | ICD-10-CM | POA: Diagnosis not present

## 2022-04-29 DIAGNOSIS — I10 Essential (primary) hypertension: Secondary | ICD-10-CM | POA: Insufficient documentation

## 2022-04-29 DIAGNOSIS — Z7982 Long term (current) use of aspirin: Secondary | ICD-10-CM | POA: Insufficient documentation

## 2022-04-29 DIAGNOSIS — Z85820 Personal history of malignant melanoma of skin: Secondary | ICD-10-CM | POA: Diagnosis not present

## 2022-04-29 DIAGNOSIS — G20A1 Parkinson's disease without dyskinesia, without mention of fluctuations: Secondary | ICD-10-CM | POA: Insufficient documentation

## 2022-04-29 DIAGNOSIS — Z79899 Other long term (current) drug therapy: Secondary | ICD-10-CM | POA: Diagnosis not present

## 2022-04-29 DIAGNOSIS — Z5181 Encounter for therapeutic drug level monitoring: Secondary | ICD-10-CM | POA: Diagnosis not present

## 2022-04-29 DIAGNOSIS — D0512 Intraductal carcinoma in situ of left breast: Secondary | ICD-10-CM | POA: Insufficient documentation

## 2022-04-29 MED ORDER — TORSEMIDE 20 MG PO TABS: 20.0000 mg | ORAL_TABLET | Freq: Every day | ORAL | 0 refills | Status: DC

## 2022-04-29 NOTE — Progress Notes (Signed)
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  She has not had any prior abnormal breast biopsy.  She she is G0 P0 L0.  Most recent mammogram in September 2021 showed an indeterminate left breast mass in the 3 o'clock position.  No left axillary adenopathy.  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    Final pathology showed 11 mm grade 2 DCIS with negative margins within a fibroadenoma.  pTis P NX ER greater than 90% positive   Patient started taking Arimidex in October 2021  Interval history-patient is tolerating Arimidex along with calcium and vitamin D well.  Reports occasional soreness at the site of lumpectomy but denies any new complaints at this time  ECOG PS- 1 Pain scale- 0   Review of systems- Review of Systems  Constitutional:  Positive for malaise/fatigue. Negative for chills, fever and weight loss.  HENT:  Negative for congestion, ear discharge and nosebleeds.   Eyes:  Negative for blurred vision.  Respiratory:  Negative for cough, hemoptysis, sputum production, shortness of breath and wheezing.   Cardiovascular:  Negative for chest pain, palpitations,  orthopnea and claudication.  Gastrointestinal:  Negative for abdominal pain, blood in stool, constipation, diarrhea, heartburn, melena, nausea and vomiting.  Genitourinary:  Negative for dysuria, flank pain, frequency, hematuria and urgency.  Musculoskeletal:  Negative for back pain, joint pain and myalgias.  Skin:  Negative for rash.  Neurological:  Negative for dizziness, tingling, focal weakness, seizures, weakness and headaches.  Endo/Heme/Allergies:  Does not bruise/bleed easily.  Psychiatric/Behavioral:  Negative for depression and suicidal ideas. The patient does not have insomnia.       No Known Allergies   Past Medical History:  Diagnosis Date   Basal cell carcinoma    basal cell on left nose   Breast cancer ([place])    2021 left breast DCIS, clear margins   Heart disease    Hypertension    Leaky heart valve    Parkinson disease    Personal history of radiation therapy    2021, left breast ca   Ruptured disc, thoracic    Tremor      Past Surgical History:  Procedure Laterality Date   ABDOMINAL HYSTERECTOMY     complete   APPENDECTOMY     at the same time of hysterectomy   BASAL CELL CARCINOMA EXCISION     nose   BREAST BIOPSY Left 11/30/2019   [place] bx, DCIS   BREAST BIOPSY Left 12/05/2021   Stereo Bx, Ribbon Clip, path pending   BREAST LUMPECTOMY Left 12/29/2019   Procedure: BREAST LUMPECTOMY;  Surgeon: Robert Bellow, MD;  Location: [facility] ORS;  Service: General;  Laterality: Left;   CHOLECYSTECTOMY     EYE SURGERY  2021   TONSILLECTOMY  Social History   Socioeconomic History   Marital status: Widowed    Spouse name: Not on file   Number of children: 3   Years of education: one year college   Highest education level: Not on file  Occupational History   Occupation: Retired  Tobacco Use   Smoking status: Never   Smokeless tobacco: Never  Vaping Use   Vaping Use: Never used  Substance and Sexual Activity   Alcohol use: Not Currently   Drug  use: Never   Sexual activity: Not Currently  Other Topics Concern   Not on file  Social History Narrative   Lives at home alone.   Right-handed.   2 cups caffeine daily.   Social Determinants of Health   Financial Resource Strain: Not on file  Food Insecurity: Not on file  Transportation Needs: Not on file  Physical Activity: Not on file  Stress: Not on file  Social Connections: Not on file  Intimate Partner Violence: Not on file    Family History  Problem Relation Age of Onset   COPD Mother    Heart disease Father    Diabetes Paternal Grandmother    Breast cancer Neg Hx      Current Outpatient Medications:    anastrozole (ARIMIDEX) 1 MG tablet, Take 1 tablet (1 mg total) by mouth daily., Disp: 90 tablet, Rfl: 1   aspirin EC 81 MG tablet, Take 81 mg by mouth daily., Disp: , Rfl:    Calcium Carb-Cholecalciferol (CALCIUM + D3 PO), Take 1 tablet by mouth in the morning and at bedtime. Calcium 750 mg with D3 12.5 mg - 500 UI, Disp: , Rfl:    carbidopa-levodopa (SINEMET IR) 25-100 MG tablet, Take 1 tablet by mouth 3 (three) times daily., Disp: 270 tablet, Rfl: 4   cholecalciferol (VITAMIN D3) 25 MCG (1000 UNIT) tablet, Take 1,000 Units by mouth daily., Disp: , Rfl:    Coenzyme Q10 (CO Q 10 PO), Take 200 mg by mouth daily. , Disp: , Rfl:    lisinopril (PRINIVIL,ZESTRIL) 5 MG tablet, Take 5 mg by mouth daily., Disp: , Rfl:    Multiple Vitamins-Minerals (CENTRUM ADULT PO), Take by mouth., Disp: , Rfl:    Omega-3 Fatty Acids (FISH OIL) 1200 MG CAPS, Take 1 capsule by mouth daily., Disp: , Rfl:    rasagiline (AZILECT) 1 MG TABS tablet, Take 1 tablet (1 mg total) by mouth daily., Disp: 90 tablet, Rfl: 3   simvastatin (ZOCOR) 20 MG tablet, Take 20 mg by mouth at bedtime. , Disp: , Rfl:    UNABLE TO FIND, Take 3 capsules by mouth daily. Med Name: Balance of Nature Supplement, Disp: , Rfl:    torsemide (DEMADEX) 20 MG tablet, Take 1 tablet (20 mg total) by mouth daily., Disp: 90 tablet,  Rfl: 0  Physical exam:  Vitals:   04/29/22 1137  BP: 110/71  Pulse: 79  Resp: 19  Temp: 97.8 F (36.6 C)  SpO2: 98%  Weight: 160 lb 6.4 oz (72.8 kg)   Physical Exam Cardiovascular:     Rate and Rhythm: Normal rate and regular rhythm.     Heart sounds: Normal heart sounds.  Pulmonary:     Effort: Pulmonary effort is normal.     Breath sounds: Normal breath sounds.  Skin:    General: Skin is warm and dry.  Neurological:     Mental Status: She is alert and oriented to person, place, and time.    Breast exam was performed in seated  and lying down position. Patient is status post left lumpectomy with a well-healed surgical scar. No evidence of any palpable masses. No evidence of axillary adenopathy. No evidence of any palpable masses or lumps in the right breast. No evidence of right axillary adenopathy      Latest Ref Rng & Units 05/02/2020   10:44 AM  CMP  Glucose 70 - 99 mg/dL 88   BUN 8 - 23 mg/dL 16   Creatinine 0.44 - 1.00 mg/dL 0.83   Sodium 135 - 145 mmol/L 139   Potassium 3.5 - 5.1 mmol/L 4.0   Chloride 98 - 111 mmol/L 101   CO2 22 - 32 mmol/L 26   Calcium 8.9 - 10.3 mg/dL 9.1   Total Protein 6.5 - 8.1 g/dL 6.8   Total Bilirubin 0.3 - 1.2 mg/dL 0.7   Alkaline Phos 38 - 126 U/L 49   AST 15 - 41 U/L 20   ALT 0 - 44 U/L 8       Latest Ref Rng & Units 05/02/2020   10:44 AM  CBC  WBC 4.0 - 10.5 K/uL 6.5   Hemoglobin 12.0 - 15.0 g/dL 12.2   Hematocrit 36.0 - 46.0 % 37.2   Platelets 150 - 400 K/uL 255     No images are attached to the encounter.  DG Bone Density  Result Date: 04/25/2022 EXAM: DUAL X-RAY ABSORPTIOMETRY (DXA) FOR BONE MINERAL DENSITY IMPRESSION: Your patient Stephinie Jollie completed a BMD test on 04/25/2022 using the [place] (software version: 14.10) manufactured by [employer]. The following summarizes the results of our evaluation. Technologist::TNB PATIENT BIOGRAPHICAL: Name: Gabbrielle, Saab Patient ID: [id number]  Birth Date: 12-16-1940 Height: 61.0 in. Gender: Female Exam Date: 04/25/2022 Weight: 174.1 lbs. Indications: Advanced Age, Caucasian, Height Loss, History of Breast Cancer, History of Radiation, Hysterectomy, Oophorectomy Bilateral, Parkinson's disease, Postmenopausal Fractures: Treatments: Anastrozole, calcium w/ vit D DENSITOMETRY RESULTS: Site         Region     Measured Date Measured Age WHO Classification Young Adult T-score BMD         %Change vs. Previous Significant Change (*) DualFemur Neck Right 04/25/2021 82.1 Osteopenia -2.1 0.745 g/cm2 -1.5% - DualFemur Neck Right 02/09/2020 79.9 Osteopenia -2.0 0.756 g/cm2 - - DualFemur Total Mean 04/25/2022 82.1 Osteopenia -1.3 0.843 g/cm2 -2.5% Yes DualFemur Total Mean 02/09/2020 79.9 Osteopenia -1.1 0.865 g/cm2 - - Left Forearm Radius 33% 04/25/2022 82.1 Normal -0.6 0.820 g/cm2 -6.3% Yes Left Forearm Radius 33% 02/09/2020 79.9 Normal 0.0 0.875 g/cm2 - - ASSESSMENT: The BMD measured at Femur Neck Right is 0.745 g/cm2 with a T-score of -2.1. This patient is considered osteopenic according to [facility]) criteria. Compared with prior study, there has been no significant change in the right hip. Compared with prior study, there has been significant decrease in the total mean. Compared with prior study, there has been significant decrease in the left forearm. Lumbar spine was not utilized due to advanced degenerative changes. The scan quality is good. World Pharmacologist [facility]) criteria for post-menopausal, Caucasian Women: Normal:                   T-score at or above -1 SD Osteopenia/low bone mass: T-score between -1 and -2.5 SD Osteoporosis:             T-score at or below -2.5 SD RECOMMENDATIONS: 1. All patients should optimize calcium and vitamin D intake. 2. Consider FDA-approved medical therapies in postmenopausal  women and men aged 68 years and older, based on the following: a. A hip or vertebral(clinical or morphometric) fracture b.  T-score < -2.5 at the femoral neck or spine after appropriate evaluation to exclude secondary causes c. Low bone mass (T-score between -1.0 and -2.5 at the femoral neck or spine) and a 10-year probability of a hip fracture > 3% or a 10-year probability of a major osteoporosis-related fracture > 20% based on the [place]-adapted WHO algorithm 3. Clinician judgment and/or patient preferences may indicate treatment for people with 10-year fracture probabilities above or below these levels FOLLOW-UP: People with diagnosed cases of osteoporosis or at high risk for fracture should have regular bone mineral density tests. For patients eligible for Medicare, routine testing is allowed once every 2 years. The testing frequency can be increased to one year for patients who have rapidly progressing disease, those who are receiving or discontinuing medical therapy to restore bone mass, or have additional risk factors. I have reviewed this report, and agree with the above findings. [facility] Dear Weston Anna Elisabet Gutzmer, Your patient DAIRYN EKLOF completed a FRAX assessment on 04/25/2022 using the [place] (analysis version: 14.10) manufactured by [employer]. The following summarizes the results of our evaluation. PATIENT BIOGRAPHICAL: Name: Anye, Wayner Patient ID: [id number] Birth Date: July 09, 1940 Height:    61.0 in. Gender:     Female    [age]       Weight:    174.1 lbs. Ethnicity:  White                            Exam Date: 04/25/2022 FRAX* RESULTS:  (version: 3.5) 10-year Probability of Fracture1 Major Osteoporotic Fracture2 Hip Fracture 15.6% 4.9% Population: [place] (Caucasian) Risk Factors: None Based on DualFemur (Right) Neck BMD 1 -The 10-year probability of fracture may be lower than reported if the patient has received treatment. 2 -Major Osteoporotic Fracture: Clinical Spine, Forearm, Hip or Shoulder *FRAX is a Materials engineer of the [employer] for  Metabolic Bone Disease, a [place] (WHO) [facility]. ASSESSMENT: The probability of a major osteoporotic fracture is 15.6% within the next ten years. The probability of a hip fracture is 4.9% within the next ten years. Electronically Signed   By: Ammie Ferrier M.D.   On: 04/25/2022 13:33     Assessment and plan- Patient is a 82 y.o. female with left breast DCIS currently on Arimidex.  This is a routine follow-up visit  Clinically patient is doing well with no concerning signs and symptoms of recurrence based on today's exam.  Patient will continue to take Arimidex along with calcium and vitamin D for 5 years ending in October 2026.  She needs to take calcium 1200 mg along with vitamin D 800 international units prophylactically.  I also discussed the results of bone density scan which showed gradually worsening T-scores in her forearm as well as femur.  Her 10-year probability of a major osteoporotic fracture was less than 20% but hip fracture was more than 3%.  It is therefore reasonable to consider additional bisphosphonates given her significant osteopenia.  We discussed both oral versus parenteral bisphosphonates and patient is willing to try it weekly Fosamax at this time.  Discussed risks and benefits of Fosamax including all but not limited to possible risk of reflux disease.  I have also gone over the instructions  of how to take Fosamax for optimal absorption.  Patient is willing to give it a try.  We also discussed alternatives including switching her from Arimidex to tamoxifen.  As long as she is able to tolerate Fosamax I am okay for her to continuing Arimidex at this time.  I will see her back in 6 months no labs.  She would be due for her routine mammogram in September 2024 which I will schedule   Visit Diagnosis 1. Ductal carcinoma in situ (DCIS) of left breast   2. Visit for monitoring Arimidex therapy   3. Osteopenia of neck of left femur      Dr.  Randa Evens, MD, MPH [facility] [id number] 04/29/2022 6:11 PM

## 2022-04-30 MED ORDER — ALENDRONATE SODIUM 70 MG PO TABS: 70.0000 mg | ORAL_TABLET | ORAL | 3 refills | Status: DC

## 2022-04-30 NOTE — Addendum Note (Signed)
Addended by: Luella Cook on: 04/30/2022 09:06 AM   Modules accepted: Orders

## 2022-05-06 ENCOUNTER — Encounter: Payer: Self-pay | Admitting: Radiation Oncology

## 2022-05-06 ENCOUNTER — Telehealth: Payer: Self-pay | Admitting: *Deleted

## 2022-05-06 ENCOUNTER — Ambulatory Visit
Admission: RE | Admit: 2022-05-06 | Discharge: 2022-05-06 | Disposition: A | Discharge status: Home / Self Care | Payer: Medicare HMO | Source: Ambulatory Visit | Attending: Radiation Oncology | Admitting: Radiation Oncology

## 2022-05-06 VITALS — BP 126/83 | HR 81 | Temp 97.7°F | Wt 167.2 lb

## 2022-05-06 DIAGNOSIS — Z923 Personal history of irradiation: Secondary | ICD-10-CM | POA: Diagnosis not present

## 2022-05-06 DIAGNOSIS — Z170 Estrogen receptor positive status [ER+]: Secondary | ICD-10-CM | POA: Diagnosis not present

## 2022-05-06 DIAGNOSIS — D0512 Intraductal carcinoma in situ of left breast: Secondary | ICD-10-CM | POA: Insufficient documentation

## 2022-05-06 DIAGNOSIS — Z79811 Long term (current) use of aromatase inhibitors: Secondary | ICD-10-CM | POA: Insufficient documentation

## 2022-05-06 NOTE — Progress Notes (Signed)
Radiation Oncology Follow up Note  Name: Sheila Mitchell   Date:   05/06/2022 MRN:  [id number] DOB: 1940-04-22    This 82 y.o. female presents to the clinic today for patient.  2-year follow-up status post whole breast radiation to her left breast for ER positive ductal carcinoma in situ REFERRING PROVIDER: Evern Bio, NP  HPI: Patient is a an 82 year old female now out over 2 years having pleated whole breast radiation to her left breast for ER positive ductal carcinoma in situ seen today in routine follow-up she is doing well she recently started on Fosamax which is causing significant skeletal pain she is contacting Dr. Janese Banks about that.  She specifically denies breast tenderness cough or bone pain..  On her last mammograms back in September she had indeterminant calcifications of the left breast which were biopsied showing no evidence of atypia or malignancy.  There was fat necrosis with associated calcifications.  COMPLICATIONS OF TREATMENT: none  FOLLOW UP COMPLIANCE: keeps appointments   PHYSICAL EXAM:  BP 126/83 (BP Location: Right Arm, Patient Position: Sitting, Cuff Size: Normal)   Pulse 81   Temp 97.7 F (36.5 C) (Tympanic)   Wt 167 lb 3.2 oz (75.8 kg)   BMI 31.59 kg/m  Lungs are clear to A&P cardiac examination essentially unremarkable with regular rate and rhythm. No dominant mass or nodularity is noted in either breast in 2 positions examined. Incision is well-healed. No axillary or supraclavicular adenopathy is appreciated. Cosmetic result is excellent.  Well-developed well-nourished patient in NAD. HEENT reveals PERLA, EOMI, discs not visualized.  Oral cavity is clear. No oral mucosal lesions are identified. Neck is clear without evidence of cervical or supraclavicular adenopathy. Lungs are clear to A&P. Cardiac examination is essentially unremarkable with regular rate and rhythm without murmur rub or thrill. Abdomen is benign with no organomegaly or masses noted. Motor  sensory and DTR levels are equal and symmetric in the upper and lower extremities. Cranial nerves II through XII are grossly intact. Proprioception is intact. No peripheral adenopathy or edema is identified. No motor or sensory levels are noted. Crude visual fields are within normal range.  RADIOLOGY RESULTS: Mammograms reviewed compatible with above-stated findings  PLAN: Present time patient is now out over 2 years with no evidence of disease.  On pleased with her overall progress.  She will contact Dr. Janese Banks about bone pain caused by the Fosamax.  I have otherwise asked to see her back for 1 more time in a year and then I will discontinue follow-up care.  She is currently on Arimidex tolerating it well without side effect.  Patient is to call with any concerns.  I would like to take this opportunity to thank you for allowing me to participate in the care of your patient.Noreene Filbert, MD

## 2022-05-06 NOTE — Telephone Encounter (Signed)
Pt states that she took the fosamax and she took it 8 am with lots of water. No other meds. She set upright for over an hour. She was having bilateral hip pain. The next day her left hip hurt but not as much. I asked if pt can try again and if it is bad as the  first one we can cancel the fosamax if the side effects are too much for pt. She will try next week and see how it goes and pt will call us.

## 2022-05-22 ENCOUNTER — Telehealth: Payer: Self-pay | Admitting: *Deleted

## 2022-05-22 NOTE — Telephone Encounter (Signed)
Pt called and left message that she took the medicine again and within 1 hour she started having hip pain again. It lasts for several days. After speaking to her it is her fosamax and not her AI pill. She takes it once a week and does not eat , drinks it with a glass of water and sits up for 1 hour. So I spoke to [place] and she said to tell the pt. To stop the med and little bit after Dr/ Rap gets back from vacation we will get her a visit. Pt is ok with this

## 2022-06-19 ENCOUNTER — Other Ambulatory Visit: Payer: Self-pay

## 2022-06-19 DIAGNOSIS — E782 Mixed hyperlipidemia: Secondary | ICD-10-CM

## 2022-06-19 DIAGNOSIS — I10 Essential (primary) hypertension: Secondary | ICD-10-CM

## 2022-06-20 MED ORDER — SIMVASTATIN 20 MG PO TABS: 20.0000 mg | ORAL_TABLET | Freq: Every day | ORAL | 0 refills | Status: DC

## 2022-06-20 MED ORDER — LISINOPRIL 5 MG PO TABS: 5.0000 mg | ORAL_TABLET | Freq: Every day | ORAL | 0 refills | Status: DC

## 2022-07-15 ENCOUNTER — Other Ambulatory Visit: Payer: Medicare HMO

## 2022-07-15 ENCOUNTER — Other Ambulatory Visit: Payer: Self-pay | Admitting: Nurse Practitioner

## 2022-07-15 DIAGNOSIS — Z13220 Encounter for screening for lipoid disorders: Secondary | ICD-10-CM

## 2022-07-15 DIAGNOSIS — I10 Essential (primary) hypertension: Secondary | ICD-10-CM | POA: Diagnosis not present

## 2022-07-15 DIAGNOSIS — Z131 Encounter for screening for diabetes mellitus: Secondary | ICD-10-CM

## 2022-07-15 DIAGNOSIS — Z1329 Encounter for screening for other suspected endocrine disorder: Secondary | ICD-10-CM | POA: Diagnosis not present

## 2022-07-16 LAB — CMP14+EGFR
ALT: 9 IU/L (ref 0–32)
AST: 18 IU/L (ref 0–40)
Albumin/Globulin Ratio: 1.7 (ref 1.2–2.2)
Albumin: 4.3 g/dL (ref 3.7–4.7)
Alkaline Phosphatase: 70 IU/L (ref 44–121)
BUN/Creatinine Ratio: 17 (ref 12–28)
BUN: 16 mg/dL (ref 8–27)
Bilirubin Total: 0.4 mg/dL (ref 0.0–1.2)
CO2: 21 mmol/L (ref 20–29)
Calcium: 9.7 mg/dL (ref 8.7–10.3)
Chloride: 101 mmol/L (ref 96–106)
Creatinine, Ser: 0.92 mg/dL (ref 0.57–1.00)
Globulin, Total: 2.5 g/dL (ref 1.5–4.5)
Glucose: 96 mg/dL (ref 70–99)
Potassium: 4.4 mmol/L (ref 3.5–5.2)
Sodium: 142 mmol/L (ref 134–144)
Total Protein: 6.8 g/dL (ref 6.0–8.5)
eGFR: 62 mL/min/{1.73_m2} (ref >59)

## 2022-07-16 LAB — CBC WITH DIFFERENTIAL/PLATELET
Basophils Absolute: 0.1 10*3/uL (ref 0.0–0.2)
Basos: 1 %
EOS (ABSOLUTE): 0.2 10*3/uL (ref 0.0–0.4)
Eos: 3 %
Hematocrit: 42 % (ref 34.0–46.6)
Hemoglobin: 13.6 g/dL (ref 11.1–15.9)
Immature Grans (Abs): 0 10*3/uL (ref 0.0–0.1)
Immature Granulocytes: 0 %
Lymphocytes Absolute: 2.5 10*3/uL (ref 0.7–3.1)
Lymphs: 40 %
MCH: 28.9 pg (ref 26.6–33.0)
MCHC: 32.4 g/dL (ref 31.5–35.7)
MCV: 89 fL (ref 79–97)
Monocytes Absolute: 0.3 10*3/uL (ref 0.1–0.9)
Monocytes: 5 %
Neutrophils Absolute: 3.2 10*3/uL (ref 1.4–7.0)
Neutrophils: 51 %
Platelets: 235 10*3/uL (ref 150–450)
RBC: 4.7 x10E6/uL (ref 3.77–5.28)
RDW: 13.4 % (ref 11.7–15.4)
WBC: 6.3 10*3/uL (ref 3.4–10.8)

## 2022-07-16 LAB — HEMOGLOBIN A1C
Est. average glucose Bld gHb Est-mCnc: 114 mg/dL
Hgb A1c MFr Bld: 5.6 % (ref 4.8–5.6)

## 2022-07-16 LAB — LIPID PANEL
Chol/HDL Ratio: 2.2 ratio (ref 0.0–4.4)
Cholesterol, Total: 152 mg/dL (ref 100–199)
HDL: 70 mg/dL (ref >39)
LDL Chol Calc (NIH): 64 mg/dL (ref 0–99)
Triglycerides: 97 mg/dL (ref 0–149)
VLDL Cholesterol Cal: 18 mg/dL (ref 5–40)

## 2022-07-16 LAB — TSH: TSH: 1.67 u[IU]/mL (ref 0.450–4.500)

## 2022-07-18 ENCOUNTER — Ambulatory Visit: Payer: Medicare HMO | Admitting: Nurse Practitioner

## 2022-07-19 ENCOUNTER — Encounter: Payer: Self-pay | Admitting: Nurse Practitioner

## 2022-07-19 ENCOUNTER — Ambulatory Visit (INDEPENDENT_AMBULATORY_CARE_PROVIDER_SITE_OTHER): Payer: Medicare HMO | Admitting: Nurse Practitioner

## 2022-07-19 VITALS — BP 110/70 | HR 85 | Ht 60.0 in | Wt 165.0 lb

## 2022-07-19 DIAGNOSIS — E782 Mixed hyperlipidemia: Secondary | ICD-10-CM

## 2022-07-19 DIAGNOSIS — R269 Unspecified abnormalities of gait and mobility: Secondary | ICD-10-CM | POA: Diagnosis not present

## 2022-07-19 DIAGNOSIS — G20A1 Parkinson's disease without dyskinesia, without mention of fluctuations: Secondary | ICD-10-CM | POA: Diagnosis not present

## 2022-07-19 DIAGNOSIS — I10 Essential (primary) hypertension: Secondary | ICD-10-CM | POA: Diagnosis not present

## 2022-07-19 NOTE — Progress Notes (Signed)
Established Patient Office Visit  Subjective:  Patient ID: Sheila Mitchell, female    DOB: 18-Jul-1940  Age: 82 y.o. MRN: [id number]  Chief Complaint  Patient presents with   Follow-up    6 month follow up    6 month followup and fasting lab review results.  A1c is at 5.6% and LDL is at 64 mg/dl.  Pt does report episode on her Apple watch that stated she was in afib for a few seconds.  Will check EKG today.    No other concerns at this time.   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, clear margins   Heart disease    Hypertension    Leaky heart valve    Parkinson disease    Personal history of radiation therapy    2021, left breast ca   Ruptured disc, thoracic    Tremor     Past Surgical History:  Procedure Laterality Date   ABDOMINAL HYSTERECTOMY     complete   APPENDECTOMY     at the same time of hysterectomy   BASAL CELL CARCINOMA EXCISION     nose   BREAST BIOPSY Left 11/30/2019   [place] bx, DCIS   BREAST BIOPSY Left 12/05/2021   Stereo Bx, Ribbon Clip, path pending   BREAST LUMPECTOMY Left 12/29/2019   Procedure: BREAST LUMPECTOMY;  Surgeon: Earline Mayotte, MD;  Location: [facility] ORS;  Service: General;  Laterality: Left;   CHOLECYSTECTOMY     EYE SURGERY  2021   TONSILLECTOMY      Social History   Socioeconomic History   Marital status: Widowed    Spouse name: Not on file   Number of children: 3   Years of education: one year college   Highest education level: Not on file  Occupational History   Occupation: Retired  Tobacco Use   Smoking status: Never   Smokeless tobacco: Never  Vaping Use   Vaping Use: Never used  Substance and Sexual Activity   Alcohol use: Not Currently   Drug use: Never   Sexual activity: Not Currently  Other Topics Concern   Not on file  Social History Narrative   Lives at home alone.   Right-handed.   2 cups caffeine  daily.   Social Determinants of Health   Financial Resource Strain: Not on file  Food Insecurity: Not on file  Transportation Needs: Not on file  Physical Activity: Not on file  Stress: Not on file  Social Connections: Not on file  Intimate Partner Violence: Not on file    Family History  Problem Relation Age of Onset   COPD Mother    Heart disease Father    Diabetes Paternal Grandmother    Breast cancer Neg Hx     No Known Allergies  Review of Systems  Constitutional: Negative.   HENT: Negative.    Eyes: Negative.   Respiratory: Negative.    Cardiovascular: Negative.   Gastrointestinal: Negative.   Genitourinary: Negative.   Musculoskeletal:  Positive for joint pain and myalgias.  Skin: Negative.   Neurological: Negative.   Endo/Heme/Allergies: Negative.   Psychiatric/Behavioral: Negative.         Objective:   BP 110/70   Pulse 85   Ht 5' (1.524 m)   Wt 165 lb (74.8 kg)   SpO2 97%   BMI 32.22 kg/m  Vitals:   07/19/22 1253  BP: 110/70  Pulse: 85  Height: 5' (1.524 m)  Weight: 165 lb (74.8 kg)  SpO2: 97%  BMI (Calculated): 32.22    Physical Exam Vitals reviewed.  Constitutional:      Appearance: Normal appearance.  HENT:     Head: Normocephalic.     Nose: Nose normal.     Mouth/Throat:     Mouth: Mucous membranes are moist.  Eyes:     Pupils: Pupils are equal, round, and reactive to light.  Cardiovascular:     Rate and Rhythm: Normal rate and regular rhythm.  Pulmonary:     Effort: Pulmonary effort is normal.     Breath sounds: Normal breath sounds.  Abdominal:     General: Bowel sounds are normal.     Palpations: Abdomen is soft.  Musculoskeletal:        General: Normal range of motion.     Cervical back: Normal range of motion and neck supple.  Skin:    General: Skin is warm and dry.  Neurological:     Mental Status: She is alert and oriented to person, place, and time.  Psychiatric:        Mood and Affect: Mood normal.         Behavior: Behavior normal.      No results found for any visits on 07/19/22.  Recent Results (from the past 2160 hour(s))  Hemoglobin A1c     Status: None   Collection Time: 07/15/22 10:31 AM  Result Value Ref Range   Hgb A1c MFr Bld 5.6 4.8 - 5.6 %    Comment:          Prediabetes: 5.7 - 6.4          Diabetes: >6.4          Glycemic control for adults with diabetes: <7.0    Est. average glucose Bld gHb Est-mCnc 114 mg/dL  TSH     Status: None   Collection Time: 07/15/22 10:31 AM  Result Value Ref Range   TSH 1.670 0.450 - 4.500 uIU/mL  CMP14+EGFR     Status: None   Collection Time: 07/15/22 10:31 AM  Result Value Ref Range   Glucose 96 70 - 99 mg/dL   BUN 16 8 - 27 mg/dL   Creatinine, Ser 4.09 0.57 - 1.00 mg/dL   eGFR 62 >81 XB/JYN/8.29   BUN/Creatinine Ratio 17 12 - 28   Sodium 142 134 - 144 mmol/L   Potassium 4.4 3.5 - 5.2 mmol/L   Chloride 101 96 - 106 mmol/L   CO2 21 20 - 29 mmol/L   Calcium 9.7 8.7 - 10.3 mg/dL   Total Protein 6.8 6.0 - 8.5 g/dL   Albumin 4.3 3.7 - 4.7 g/dL   Globulin, Total 2.5 1.5 - 4.5 g/dL   Albumin/Globulin Ratio 1.7 1.2 - 2.2   Bilirubin Total 0.4 0.0 - 1.2 mg/dL   Alkaline Phosphatase 70 44 - 121 IU/L   AST 18 0 - 40 IU/L   ALT 9 0 - 32 IU/L  Lipid panel     Status: None   Collection Time: 07/15/22 10:31 AM  Result Value Ref Range   Cholesterol, Total 152 100 - 199 mg/dL   Triglycerides 97 0 - 149 mg/dL   HDL 70 >56 mg/dL   VLDL Cholesterol Cal 18 5 - 40 mg/dL   LDL Chol Calc (NIH) 64 0 - 99 mg/dL   Chol/HDL Ratio 2.2 0.0 - 4.4  ratio    Comment:                                   T. Chol/HDL Ratio                                             Men  Women                               1/2 Avg.Risk  3.4    3.3                                   Avg.Risk  5.0    4.4                                2X Avg.Risk  9.6    7.1                                3X Avg.Risk 23.4   11.0   CBC with Diff     Status: None   Collection Time: 07/15/22  10:31 AM  Result Value Ref Range   WBC 6.3 3.4 - 10.8 x10E3/uL   RBC 4.70 3.77 - 5.28 x10E6/uL   Hemoglobin 13.6 11.1 - 15.9 g/dL   Hematocrit 16.1 09.6 - 46.6 %   MCV 89 79 - 97 fL   MCH 28.9 26.6 - 33.0 pg   MCHC 32.4 31.5 - 35.7 g/dL   RDW 04.5 40.9 - 81.1 %   Platelets 235 150 - 450 x10E3/uL   Neutrophils 51 Not Estab. %   Lymphs 40 Not Estab. %   Monocytes 5 Not Estab. %   Eos 3 Not Estab. %   Basos 1 Not Estab. %   Neutrophils Absolute 3.2 1.4 - 7.0 x10E3/uL   Lymphocytes Absolute 2.5 0.7 - 3.1 x10E3/uL   Monocytes Absolute 0.3 0.1 - 0.9 x10E3/uL   EOS (ABSOLUTE) 0.2 0.0 - 0.4 x10E3/uL   Basophils Absolute 0.1 0.0 - 0.2 x10E3/uL   Immature Granulocytes 0 Not Estab. %   Immature Grans (Abs) 0.0 0.0 - 0.1 x10E3/uL      Assessment & Plan:   Problem List Items Addressed This Visit   None   No follow-ups on file.   Total time spent: 35 minutes  Orson Eva, NP  07/19/2022

## 2022-07-19 NOTE — Patient Instructions (Addendum)
1) Follow up appt in 6 months, fasting labs prior 2) EKG today- bigeminy 3) Referred to Dr. Juliann Pares 4) Follow up appt in 6 months, fasting labs prior

## 2022-07-22 ENCOUNTER — Encounter: Payer: Self-pay | Admitting: Cardiovascular Disease

## 2022-07-26 ENCOUNTER — Other Ambulatory Visit: Payer: Self-pay | Admitting: Cardiovascular Disease

## 2022-07-31 ENCOUNTER — Encounter: Payer: Self-pay | Admitting: Nurse Practitioner

## 2022-08-02 DIAGNOSIS — G20A1 Parkinson's disease without dyskinesia, without mention of fluctuations: Secondary | ICD-10-CM | POA: Diagnosis not present

## 2022-08-02 DIAGNOSIS — E785 Hyperlipidemia, unspecified: Secondary | ICD-10-CM | POA: Diagnosis not present

## 2022-08-02 DIAGNOSIS — I10 Essential (primary) hypertension: Secondary | ICD-10-CM | POA: Diagnosis not present

## 2022-08-02 DIAGNOSIS — I2510 Atherosclerotic heart disease of native coronary artery without angina pectoris: Secondary | ICD-10-CM | POA: Diagnosis not present

## 2022-08-02 DIAGNOSIS — M5137 Other intervertebral disc degeneration, lumbosacral region: Secondary | ICD-10-CM | POA: Diagnosis not present

## 2022-08-02 DIAGNOSIS — E669 Obesity, unspecified: Secondary | ICD-10-CM | POA: Diagnosis not present

## 2022-08-02 DIAGNOSIS — R011 Cardiac murmur, unspecified: Secondary | ICD-10-CM | POA: Diagnosis not present

## 2022-08-02 DIAGNOSIS — R0602 Shortness of breath: Secondary | ICD-10-CM | POA: Diagnosis not present

## 2022-08-05 ENCOUNTER — Other Ambulatory Visit: Payer: Self-pay | Admitting: Internal Medicine

## 2022-08-05 DIAGNOSIS — I2510 Atherosclerotic heart disease of native coronary artery without angina pectoris: Secondary | ICD-10-CM

## 2022-08-13 ENCOUNTER — Ambulatory Visit
Admission: RE | Admit: 2022-08-13 | Discharge: 2022-08-13 | Disposition: A | Discharge status: Home / Self Care | Payer: Self-pay | Source: Ambulatory Visit | Attending: Internal Medicine | Admitting: Internal Medicine

## 2022-08-13 DIAGNOSIS — I2510 Atherosclerotic heart disease of native coronary artery without angina pectoris: Secondary | ICD-10-CM | POA: Insufficient documentation

## 2022-08-13 DIAGNOSIS — R011 Cardiac murmur, unspecified: Secondary | ICD-10-CM | POA: Diagnosis not present

## 2022-09-23 ENCOUNTER — Other Ambulatory Visit: Payer: Self-pay | Admitting: Cardiovascular Disease

## 2022-09-23 DIAGNOSIS — I10 Essential (primary) hypertension: Secondary | ICD-10-CM

## 2022-09-23 DIAGNOSIS — I2510 Atherosclerotic heart disease of native coronary artery without angina pectoris: Secondary | ICD-10-CM | POA: Diagnosis not present

## 2022-09-23 DIAGNOSIS — E782 Mixed hyperlipidemia: Secondary | ICD-10-CM

## 2022-09-23 DIAGNOSIS — E785 Hyperlipidemia, unspecified: Secondary | ICD-10-CM | POA: Diagnosis not present

## 2022-09-23 DIAGNOSIS — G20A1 Parkinson's disease without dyskinesia, without mention of fluctuations: Secondary | ICD-10-CM | POA: Diagnosis not present

## 2022-09-23 DIAGNOSIS — E669 Obesity, unspecified: Secondary | ICD-10-CM | POA: Diagnosis not present

## 2022-09-23 DIAGNOSIS — R600 Localized edema: Secondary | ICD-10-CM | POA: Diagnosis not present

## 2022-09-30 DIAGNOSIS — H524 Presbyopia: Secondary | ICD-10-CM | POA: Diagnosis not present

## 2022-10-10 ENCOUNTER — Ambulatory Visit: Payer: Medicare HMO | Admitting: Neurology

## 2022-10-10 ENCOUNTER — Encounter: Payer: Self-pay | Admitting: Neurology

## 2022-10-10 VITALS — BP 127/70 | HR 78 | Ht 60.0 in | Wt 167.5 lb

## 2022-10-10 DIAGNOSIS — G20A1 Parkinson's disease without dyskinesia, without mention of fluctuations: Secondary | ICD-10-CM

## 2022-10-10 MED ORDER — CARBIDOPA-LEVODOPA 25-100 MG PO TABS: 1.0000 | ORAL_TABLET | Freq: Three times a day (TID) | ORAL | 4 refills | Status: DC

## 2022-10-10 MED ORDER — RASAGILINE MESYLATE 1 MG PO TABS: 1.0000 mg | ORAL_TABLET | Freq: Every day | ORAL | 3 refills | Status: DC

## 2022-10-10 NOTE — Progress Notes (Signed)
Patient: Sheila Mitchell Date of Birth: 1940/04/24  Reason for Visit: Follow up History from: Patient Primary Neurologist: Terrace Arabia  ASSESSMENT AND PLAN 82 y.o. year old female   1.  Idiopathic Parkinson's disease  -Continues to do very well, commended on her commitment to rock steady boxing -Continue Sinemet 25/100 mg 1 tablet 3 times daily, Azilect 1 mg daily -Recommended continue exercise -Follow-up 6 months or sooner if needed  HISTORY   Sheila Mitchell is a 82 years old female, seen in request by her primary care NP Orson Eva for evaluation of Parkinson's disease.  Initial evaluation was on April 06, 2018    She has past medical history of hypertension, hyperlipidemia.   Patient reported a history of right hand tremor since summer 2019, smaller print with prolonged writing, she also noticed mild gait difficulty, difficulty initiate gait when first get up, unsteady gait, dragging her right leg across the floor, initially attributed to her hip, low back problem, had physical therapy which was helpful, also reported MRI of lumbar and thoracic spine by [facility] orthopedic surgeon reviewed degenerative disc disease, is planning on to have epidural injection.   She had decreased sense of smell for more than 10 years, denied constipation, occasionally orthostatic dizziness, tends to restless when sleep,   MRI of the brain in February 2020, showed no significant abnormalities, symptoms of PD include: tremor of right hand, gait difficulty, decreased sense of smell, restless sleeping    She joined a Parkinson's disease exercise group, she goes 3 times a week to do boxing, exercise.  She has been lifting weights.    UPDATE July 08 2019: She complains of increased gait abnormality, freeze difficulty moving her left leg, increased left hand tremor,   Update March 02, 2020 SS: When last seen, Sinemet 25/100 mg 3 times daily was started, referred to physical therapy due to  worsening symptoms, gait abnormality.   Currently taking Sinemet 9:30 am, 3:45 pm, 10:45 pm, notes wearing off effect, feeling tired around 2 and 3 afternoon, does her exercise at 4:00 is hard for her.  Just finished radiation for breast cancer, has started oral chemo Anastrozole. Hopes to get back to her boxing, once cleared by oncologist.  Has noted big improvement with Sinemet, more confident in walking, less tremor to left hand, less fatigue.  No falls.   Update August 31, 2020 SS: Back to boxing 3 times a week, left hip bursitis got a steroid shot is better, did some PT for low back pain, has been really good overall, 5 more sessions. PD feels stable. Taking Sinemet 25-100 mg 3 times daily 9:30 AM, 2:30, 7:30 PM, sees oncologist for breast cancer history coming up, remains on Arimidex. No problems getting out of chair. Hardly every tremor in left hand.    UPDATE Feb 26 2021: Dr. Terrace Arabia  She is overall doing very well, taking Sinemet 25/100 mg twice daily, also exercises regularly, participate in rock boxing   Update September 04, 2021 SS: Doing great today, on Sinemet 25/100, 1 tablets 3 times daily, 9:30, 2:30, 7:30. Dr. Terrace Arabia added Azilect last visit, no issues or change, does cost $40/month this is ok. Does rock steady boxing 3 times a week, got new sneakers today. Feels smell and handwriting has improved.  Had a fall back in February visiting her sister in [place], had to get stitches to left eyebrow, tripped down stairs navigating her luggage.   Update March 21, 2022 SS: Via [facility], Has seen good  improvement with her walking with her boxing  classes. Remains on sinemet 25/100 9:30 2:30 7:30. On Azilect. No freezing. No recent falls.  Very pleased.   Update October 10, 2022 SS: Doing [employer] 3 times weekly. Is doing great. On Sinemet 25/100 mg 3 times daily, Azilect 1 mg daily. No falls, getting around well. Next sinemet due to 2:30 (exam at 1:30).  REVIEW OF SYSTEMS: Out of a complete 14 system review  of symptoms, the patient complains only of the following symptoms, and all other reviewed systems are negative.  See HPI  ALLERGIES: No Known Allergies  HOME MEDICATIONS: Outpatient Medications Prior to Visit  Medication Sig Dispense Refill   anastrozole (ARIMIDEX) 1 MG tablet Take 1 tablet (1 mg total) by mouth daily. 90 tablet 1   aspirin EC 81 MG tablet Take 81 mg by mouth daily.     Calcium Carb-Cholecalciferol (CALCIUM + D3 PO) Take 1 tablet by mouth in the morning and at bedtime. Calcium 750 mg with D3 12.5 mg - 500 UI     cholecalciferol (VITAMIN D3) 25 MCG (1000 UNIT) tablet Take 1,000 Units by mouth daily.     Coenzyme Q10 (CO Q 10 PO) Take 200 mg by mouth daily.      lisinopril (ZESTRIL) 5 MG tablet TAKE 1 TABLET DAILY 90 tablet 0   Multiple Vitamins-Minerals (CENTRUM ADULT PO) Take by mouth.     Omega-3 Fatty Acids (FISH OIL) 1200 MG CAPS Take 1 capsule by mouth daily.     simvastatin (ZOCOR) 20 MG tablet TAKE 1 TABLET AT BEDTIME 90 tablet 0   torsemide (DEMADEX) 20 MG tablet TAKE 1 TABLET BY MOUTH EVERY DAY (Patient taking differently: Take 20 mg by mouth as needed.) 90 tablet 0   UNABLE TO FIND Take 3 capsules by mouth daily. Med Name: Balance of Ashby Dawes Supplement     carbidopa-levodopa (SINEMET IR) 25-100 MG tablet Take 1 tablet by mouth 3 (three) times daily. 270 tablet 4   rasagiline (AZILECT) 1 MG TABS tablet Take 1 tablet (1 mg total) by mouth daily. 90 tablet 3   No facility-administered medications prior to visit.   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, clear margins   Heart disease    Hypertension    Leaky heart valve    Parkinson disease    Personal history of radiation therapy    2021, left breast ca   Ruptured disc, thoracic    Tremor     PAST SURGICAL HISTORY: Past Surgical History:  Procedure Laterality Date   ABDOMINAL  HYSTERECTOMY     complete   APPENDECTOMY     at the same time of hysterectomy   BASAL CELL CARCINOMA EXCISION     nose   BREAST BIOPSY Left 11/30/2019   [place] bx, DCIS   BREAST BIOPSY Left 12/05/2021   Stereo Bx, Ribbon Clip, path pending   BREAST LUMPECTOMY Left 12/29/2019   Procedure: BREAST LUMPECTOMY;  Surgeon: Earline Mayotte, MD;  Location: [facility] ORS;  Service: General;  Laterality: Left;   CHOLECYSTECTOMY     EYE SURGERY  2021   TONSILLECTOMY     FAMILY HISTORY: Family History  Problem Relation Age of Onset   COPD Mother    Heart disease Father    Diabetes Paternal Grandmother    Breast cancer  Neg Hx     SOCIAL HISTORY: Social History   Socioeconomic History   Marital status: Widowed    Spouse name: Not on file   Number of children: 3   Years of education: one year college   Highest education level: Not on file  Occupational History   Occupation: Retired  Tobacco Use   Smoking status: Never   Smokeless tobacco: Never  Vaping Use   Vaping status: Never Used  Substance and Sexual Activity   Alcohol use: Not Currently   Drug use: Never   Sexual activity: Not Currently  Other Topics Concern   Not on file  Social History Narrative   Lives at home alone.   Right-handed.   2 cups caffeine daily.   Social Determinants of Health   Financial Resource Strain: Not on file  Food Insecurity: Not on file  Transportation Needs: Not on file  Physical Activity: Not on file  Stress: Not on file  Social Connections: Not on file  Intimate Partner Violence: Not on file    PHYSICAL EXAM  Vitals:   10/10/22 1316  BP: 127/70  Pulse: 78  Weight: 167 lb 8 oz (76 kg)  Height: 5' (1.524 m)   Body mass index is 32.71 kg/m.  Generalized: Well developed, in no acute distress  Neurological examination  Mentation: Alert oriented to time, place, history taking. Follows all commands speech and language fluent Cranial nerve II-XII: Pupils were equal round reactive to  light. Extraocular movements were full, visual field were full on confrontational test. Facial sensation and strength were normal. Head turning and shoulder shrug  were normal and symmetric. Motor: The motor testing reveals 5 over 5 strength of all 4 extremities. Good symmetric motor tone is noted throughout.  Occasional intermittent resting tremors of the left hand noted.  Mild bradykinesia to left upper with hand taps. Sensory: Sensory testing is intact to soft touch on all 4 extremities. No evidence of extinction is noted.  Coordination: Cerebellar testing reveals good finger-nose-finger and heel-to-shin bilaterally.  Gait and station: Gait is normal.  Conscious of exaggerated arm swing Reflexes: Deep tendon reflexes are symmetric and normal bilaterally.   DIAGNOSTIC DATA (LABS, IMAGING, TESTING) - I reviewed patient records, labs, notes, testing and imaging myself where available.  Lab Results  Component Value Date   WBC 6.3 07/15/2022   HGB 13.6 07/15/2022   HCT 42.0 07/15/2022   MCV 89 07/15/2022   PLT 235 07/15/2022      Component Value Date/Time   NA 142 07/15/2022 1031   K 4.4 07/15/2022 1031   CL 101 07/15/2022 1031   CO2 21 07/15/2022 1031   GLUCOSE 96 07/15/2022 1031   GLUCOSE 88 05/02/2020 1044   BUN 16 07/15/2022 1031   CREATININE 0.92 07/15/2022 1031   CALCIUM 9.7 07/15/2022 1031   PROT 6.8 07/15/2022 1031   ALBUMIN 4.3 07/15/2022 1031   AST 18 07/15/2022 1031   ALT 9 07/15/2022 1031   ALKPHOS 70 07/15/2022 1031   BILITOT 0.4 07/15/2022 1031   GFRNONAA >60 05/02/2020 1044   Lab Results  Component Value Date   CHOL 152 07/15/2022   HDL 70 07/15/2022   LDLCALC 64 07/15/2022   TRIG 97 07/15/2022   CHOLHDL 2.2 07/15/2022   Lab Results  Component Value Date   HGBA1C 5.6 07/15/2022   No results found for: "VITAMINB12" Lab Results  Component Value Date   TSH 1.670 07/15/2022    Margie Ege, AGNP-C, DNP 10/10/2022, 1:35 PM  [facility] [address], [address] [place], [place] [address] [phone]

## 2022-10-10 NOTE — Patient Instructions (Signed)
You look great!  Keep up the good work with boxing.  No changes to medication.  See you in 6 months.  Thanks!!

## 2022-10-19 ENCOUNTER — Other Ambulatory Visit: Payer: Self-pay | Admitting: Oncology

## 2022-10-20 ENCOUNTER — Other Ambulatory Visit: Payer: Self-pay | Admitting: Cardiovascular Disease

## 2022-10-28 ENCOUNTER — Inpatient Hospital Stay: Payer: Medicare HMO | Attending: Oncology | Admitting: Oncology

## 2022-10-28 ENCOUNTER — Encounter: Payer: Self-pay | Admitting: Oncology

## 2022-10-28 VITALS — BP 121/70 | HR 77 | Temp 97.6°F | Resp 18 | Ht 60.0 in | Wt 168.2 lb

## 2022-10-28 DIAGNOSIS — Z5181 Encounter for therapeutic drug level monitoring: Secondary | ICD-10-CM | POA: Diagnosis not present

## 2022-10-28 DIAGNOSIS — I10 Essential (primary) hypertension: Secondary | ICD-10-CM | POA: Diagnosis not present

## 2022-10-28 DIAGNOSIS — Z79899 Other long term (current) drug therapy: Secondary | ICD-10-CM | POA: Diagnosis not present

## 2022-10-28 DIAGNOSIS — D0512 Intraductal carcinoma in situ of left breast: Secondary | ICD-10-CM | POA: Diagnosis not present

## 2022-10-28 DIAGNOSIS — Z79811 Long term (current) use of aromatase inhibitors: Secondary | ICD-10-CM | POA: Diagnosis not present

## 2022-10-28 DIAGNOSIS — Z86000 Personal history of in-situ neoplasm of breast: Secondary | ICD-10-CM | POA: Diagnosis not present

## 2022-10-28 DIAGNOSIS — Z7982 Long term (current) use of aspirin: Secondary | ICD-10-CM | POA: Insufficient documentation

## 2022-10-28 DIAGNOSIS — Z923 Personal history of irradiation: Secondary | ICD-10-CM | POA: Insufficient documentation

## 2022-10-28 DIAGNOSIS — Z85828 Personal history of other malignant neoplasm of skin: Secondary | ICD-10-CM | POA: Diagnosis not present

## 2022-10-28 DIAGNOSIS — G20A1 Parkinson's disease without dyskinesia, without mention of fluctuations: Secondary | ICD-10-CM | POA: Diagnosis not present

## 2022-10-28 DIAGNOSIS — Z170 Estrogen receptor positive status [ER+]: Secondary | ICD-10-CM | POA: Diagnosis not present

## 2022-11-03 NOTE — Progress Notes (Signed)
Hematology/Oncology Consult note [facility]  [phone] [phone]  Patient Care Team: Orson Eva, NP (Inactive) as PCP - General (Nurse Practitioner) Scarlett Presto, RN (Inactive) as Oncology Nurse Navigator (Oncology) Carmina Miller, MD as Referring Physician (Radiation Oncology) Creig Hines, MD as Consulting Physician (Oncology) Lemar Livings Merrily Pew, MD as Consulting Physician (General Surgery)   Name of the patient: Sheila Mitchell  [id number]  [birth year]   Date of visit: 11/03/22  Diagnosis- left breast DCIS ER positive  Chief complaint/ Reason for visit- routine f/u of DCIS on arimidex  Heme/Onc history: Patient is a 82 year old female referred for new diagnosis of DCIS.  She has not had any prior abnormal breast biopsy.  She she is G0 P0 L0.  Most recent mammogram in September 2021 showed an indeterminate left breast mass in the 3 o'clock position.  No left axillary adenopathy.  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    Final pathology showed 11 mm grade 2 DCIS with negative margins within a fibroadenoma.  pTis P NX ER greater than 90% positive   Patient started taking Arimidex in October 2021    Interval history- Tolerating arimidex well without significant side effects. Denies any breast concerns.   ECOG PS- 2 Pain scale- 0   Review of systems- Review of Systems  Constitutional:  Negative for chills, fever, malaise/fatigue and weight loss.  HENT:  Negative for congestion, ear discharge and nosebleeds.   Eyes:  Negative for blurred vision.  Respiratory:  Negative for cough, hemoptysis, sputum production, shortness of breath and wheezing.   Cardiovascular:  Negative for chest pain, palpitations, orthopnea and claudication.  Gastrointestinal:  Negative for abdominal pain, blood in stool, constipation, diarrhea, heartburn, melena, nausea and vomiting.  Genitourinary:   Negative for dysuria, flank pain, frequency, hematuria and urgency.  Musculoskeletal:  Negative for back pain, joint pain and myalgias.  Skin:  Negative for rash.  Neurological:  Negative for dizziness, tingling, focal weakness, seizures, weakness and headaches.  Endo/Heme/Allergies:  Does not bruise/bleed easily.  Psychiatric/Behavioral:  Negative for depression and suicidal ideas. The patient does not have insomnia.       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, clear margins   Heart disease    Hypertension    Leaky heart valve    Parkinson disease    Personal history of radiation therapy    2021, left breast ca   Ruptured disc, thoracic    Tremor      Past Surgical History:  Procedure Laterality Date   ABDOMINAL HYSTERECTOMY     complete   APPENDECTOMY     at the same time of hysterectomy   BASAL CELL CARCINOMA EXCISION     nose   BREAST BIOPSY Left 11/30/2019   [place] bx, DCIS   BREAST BIOPSY Left 12/05/2021   Stereo Bx, Ribbon Clip, path pending   BREAST LUMPECTOMY Left 12/29/2019   Procedure: BREAST LUMPECTOMY;  Surgeon: Earline Mayotte, MD;  Location: [facility] ORS;  Service: General;  Laterality: Left;   CHOLECYSTECTOMY     EYE SURGERY  2021   TONSILLECTOMY      Social History   Socioeconomic History   Marital status: Widowed    Spouse name: Not on file   Number of children: 3  Years of education: one year college   Highest education level: Not on file  Occupational History   Occupation: Retired  Tobacco Use   Smoking status: Never   Smokeless tobacco: Never  Vaping Use   Vaping status: Never Used  Substance and Sexual Activity   Alcohol use: Not Currently   Drug use: Never   Sexual activity: Not Currently  Other Topics Concern   Not on file  Social History Narrative   Lives at home alone.   Right-handed.   2 cups caffeine  daily.   Social Determinants of Health   Financial Resource Strain: Not on file  Food Insecurity: Not on file  Transportation Needs: Not on file  Physical Activity: Not on file  Stress: Not on file  Social Connections: Not on file  Intimate Partner Violence: Not on file    Family History  Problem Relation Age of Onset   COPD Mother    Heart disease Father    Diabetes Paternal Grandmother    Breast cancer Neg Hx      Current Outpatient Medications:    anastrozole (ARIMIDEX) 1 MG tablet, Take 1 tablet (1 mg total) by mouth daily., Disp: 90 tablet, Rfl: 1   aspirin EC 81 MG tablet, Take 81 mg by mouth daily., Disp: , Rfl:    Calcium Carb-Cholecalciferol (CALCIUM + D3 PO), Take 1 tablet by mouth in the morning and at bedtime. Calcium 750 mg with D3 12.5 mg - 500 UI, Disp: , Rfl:    carbidopa-levodopa (SINEMET IR) 25-100 MG tablet, Take 1 tablet by mouth 3 (three) times daily., Disp: 270 tablet, Rfl: 4   cholecalciferol (VITAMIN D3) 25 MCG (1000 UNIT) tablet, Take 1,000 Units by mouth daily., Disp: , Rfl:    Coenzyme Q10 (CO Q 10 PO), Take 200 mg by mouth daily. , Disp: , Rfl:    lisinopril (ZESTRIL) 5 MG tablet, TAKE 1 TABLET DAILY, Disp: 90 tablet, Rfl: 0   Multiple Vitamins-Minerals (CENTRUM ADULT PO), Take by mouth., Disp: , Rfl:    Omega-3 Fatty Acids (FISH OIL) 1200 MG CAPS, Take 1 capsule by mouth daily., Disp: , Rfl:    rasagiline (AZILECT) 1 MG TABS tablet, Take 1 tablet (1 mg total) by mouth daily., Disp: 90 tablet, Rfl: 3   simvastatin (ZOCOR) 20 MG tablet, TAKE 1 TABLET AT BEDTIME, Disp: 90 tablet, Rfl: 0   torsemide (DEMADEX) 20 MG tablet, TAKE 1 TABLET BY MOUTH EVERY DAY, Disp: 90 tablet, Rfl: 0   UNABLE TO FIND, Take 3 capsules by mouth daily. Med Name: Balance of Ashby Dawes Supplement, Disp: , Rfl:   Physical exam:  Vitals:   10/28/22 1131  BP: 121/70  Pulse: 77  Resp: 18  Temp: 97.6 F (36.4 C)  TempSrc: Tympanic  SpO2: 98%  Weight: 168 lb 3.2 oz (76.3 kg)   Height: 5' (1.524 m)   Physical Exam Cardiovascular:     Rate and Rhythm: Normal rate and regular rhythm.     Heart sounds: Normal heart sounds.  Pulmonary:     Effort: Pulmonary effort is normal.     Breath sounds: Normal breath sounds.  Abdominal:     General: Bowel sounds are normal.  Skin:    General: Skin is warm and dry.  Neurological:     Mental Status: She is alert and oriented to person, place, and time.   Breast exam was performed in seated and lying down position. Patient is status post left lumpectomy with a well-healed surgical  scar. No evidence of any palpable masses. No evidence of axillary adenopathy. No evidence of any palpable masses or lumps in the right breast. No evidence of right axillary adenopathy       Latest Ref Rng & Units 07/15/2022   10:31 AM  CMP  Glucose 70 - 99 mg/dL 96   BUN 8 - 27 mg/dL 16   Creatinine 1.61 - 1.00 mg/dL 0.96   Sodium 045 - 409 mmol/L 142   Potassium 3.5 - 5.2 mmol/L 4.4   Chloride 96 - 106 mmol/L 101   CO2 20 - 29 mmol/L 21   Calcium 8.7 - 10.3 mg/dL 9.7   Total Protein 6.0 - 8.5 g/dL 6.8   Total Bilirubin 0.0 - 1.2 mg/dL 0.4   Alkaline Phos 44 - 121 IU/L 70   AST 0 - 40 IU/L 18   ALT 0 - 32 IU/L 9       Latest Ref Rng & Units 07/15/2022   10:31 AM  CBC  WBC 3.4 - 10.8 x10E3/uL 6.3   Hemoglobin 11.1 - 15.9 g/dL 81.1   Hematocrit 91.4 - 46.6 % 42.0   Platelets 150 - 450 x10E3/uL 235      Assessment and plan- Patient is a 82 y.o. 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 She is due for mammogram in sept 2024 which I will schedule. She will be taking arimidex for 1 more year ending in October 2025.  Her bone density scan in feb 2024 showed osteopenia. 10 year probability of major osteoporotic fracture was <20% but hip fracture was >3%. She could not tolerate fosamax. We discussed various options  including switching to tamoxifen for 1 more year, trying parenteral bisphosphonates or continue monitoring with bone density next year without changing the present course. Patient would like to continue arimidex for now  I will see her in 6 months no labs   Visit Diagnosis 1. Encounter for follow-up surveillance of ductal carcinoma in situ (DCIS) of breast   2. Visit for monitoring Arimidex therapy      Dr. Owens Shark, MD, MPH [facility] [phone] 11/03/2022 2:33 PM

## 2022-11-20 ENCOUNTER — Ambulatory Visit
Admission: RE | Admit: 2022-11-20 | Discharge: 2022-11-20 | Disposition: A | Discharge status: Home / Self Care | Payer: Medicare HMO | Source: Ambulatory Visit | Attending: Oncology | Admitting: Oncology

## 2022-11-20 DIAGNOSIS — Z853 Personal history of malignant neoplasm of breast: Secondary | ICD-10-CM | POA: Diagnosis not present

## 2022-11-20 DIAGNOSIS — D0512 Intraductal carcinoma in situ of left breast: Secondary | ICD-10-CM | POA: Diagnosis not present

## 2022-11-20 DIAGNOSIS — R92323 Mammographic fibroglandular density, bilateral breasts: Secondary | ICD-10-CM | POA: Diagnosis not present

## 2022-11-20 DIAGNOSIS — R921 Mammographic calcification found on diagnostic imaging of breast: Secondary | ICD-10-CM | POA: Diagnosis not present

## 2022-11-27 DIAGNOSIS — D0512 Intraductal carcinoma in situ of left breast: Secondary | ICD-10-CM | POA: Diagnosis not present

## 2022-12-23 ENCOUNTER — Other Ambulatory Visit: Payer: Self-pay | Admitting: Cardiovascular Disease

## 2022-12-23 DIAGNOSIS — I10 Essential (primary) hypertension: Secondary | ICD-10-CM

## 2022-12-23 DIAGNOSIS — E782 Mixed hyperlipidemia: Secondary | ICD-10-CM

## 2023-01-01 DIAGNOSIS — D2261 Melanocytic nevi of right upper limb, including shoulder: Secondary | ICD-10-CM | POA: Diagnosis not present

## 2023-01-01 DIAGNOSIS — L2989 Other pruritus: Secondary | ICD-10-CM | POA: Diagnosis not present

## 2023-01-01 DIAGNOSIS — D2271 Melanocytic nevi of right lower limb, including hip: Secondary | ICD-10-CM | POA: Diagnosis not present

## 2023-01-01 DIAGNOSIS — L720 Epidermal cyst: Secondary | ICD-10-CM | POA: Diagnosis not present

## 2023-01-01 DIAGNOSIS — D2272 Melanocytic nevi of left lower limb, including hip: Secondary | ICD-10-CM | POA: Diagnosis not present

## 2023-01-01 DIAGNOSIS — D2262 Melanocytic nevi of left upper limb, including shoulder: Secondary | ICD-10-CM | POA: Diagnosis not present

## 2023-01-01 DIAGNOSIS — D225 Melanocytic nevi of trunk: Secondary | ICD-10-CM | POA: Diagnosis not present

## 2023-01-01 DIAGNOSIS — L821 Other seborrheic keratosis: Secondary | ICD-10-CM | POA: Diagnosis not present

## 2023-01-24 ENCOUNTER — Ambulatory Visit: Payer: Medicare HMO | Admitting: Cardiology

## 2023-02-06 ENCOUNTER — Ambulatory Visit: Payer: Medicare HMO | Admitting: Cardiology

## 2023-02-20 ENCOUNTER — Ambulatory Visit: Payer: Medicare HMO | Admitting: Cardiology

## 2023-03-03 ENCOUNTER — Other Ambulatory Visit: Payer: Medicare HMO

## 2023-03-03 ENCOUNTER — Other Ambulatory Visit: Payer: Self-pay | Admitting: Cardiology

## 2023-03-03 DIAGNOSIS — Z1329 Encounter for screening for other suspected endocrine disorder: Secondary | ICD-10-CM

## 2023-03-03 DIAGNOSIS — E782 Mixed hyperlipidemia: Secondary | ICD-10-CM | POA: Diagnosis not present

## 2023-03-03 DIAGNOSIS — I10 Essential (primary) hypertension: Secondary | ICD-10-CM | POA: Diagnosis not present

## 2023-03-03 DIAGNOSIS — Z131 Encounter for screening for diabetes mellitus: Secondary | ICD-10-CM

## 2023-03-04 LAB — CMP14+EGFR
ALT: 8 [IU]/L (ref 0–32)
AST: 20 [IU]/L (ref 0–40)
Albumin: 3.9 g/dL (ref 3.7–4.7)
Alkaline Phosphatase: 72 [IU]/L (ref 44–121)
BUN/Creatinine Ratio: 19 (ref 12–28)
BUN: 13 mg/dL (ref 8–27)
Bilirubin Total: 0.3 mg/dL (ref 0.0–1.2)
CO2: 23 mmol/L (ref 20–29)
Calcium: 9.1 mg/dL (ref 8.7–10.3)
Chloride: 106 mmol/L (ref 96–106)
Creatinine, Ser: 0.7 mg/dL (ref 0.57–1.00)
Globulin, Total: 2.2 g/dL (ref 1.5–4.5)
Glucose: 84 mg/dL (ref 70–99)
Potassium: 4.5 mmol/L (ref 3.5–5.2)
Sodium: 142 mmol/L (ref 134–144)
Total Protein: 6.1 g/dL (ref 6.0–8.5)
eGFR: 86 mL/min/{1.73_m2} (ref >59)

## 2023-03-04 LAB — LIPID PANEL
Chol/HDL Ratio: 2.1 {ratio} (ref 0.0–4.4)
Cholesterol, Total: 158 mg/dL (ref 100–199)
HDL: 75 mg/dL (ref >39)
LDL Chol Calc (NIH): 71 mg/dL (ref 0–99)
Triglycerides: 60 mg/dL (ref 0–149)
VLDL Cholesterol Cal: 12 mg/dL (ref 5–40)

## 2023-03-04 LAB — TSH: TSH: 0.88 u[IU]/mL (ref 0.450–4.500)

## 2023-03-04 LAB — HEMOGLOBIN A1C
Est. average glucose Bld gHb Est-mCnc: 108 mg/dL
Hgb A1c MFr Bld: 5.4 % (ref 4.8–5.6)

## 2023-03-06 ENCOUNTER — Ambulatory Visit: Payer: Medicare HMO | Admitting: Cardiology

## 2023-03-06 ENCOUNTER — Encounter: Payer: Self-pay | Admitting: Cardiology

## 2023-03-06 VITALS — BP 128/78 | HR 77 | Ht 61.0 in | Wt 171.8 lb

## 2023-03-06 DIAGNOSIS — E66811 Obesity, class 1: Secondary | ICD-10-CM | POA: Diagnosis not present

## 2023-03-06 DIAGNOSIS — E782 Mixed hyperlipidemia: Secondary | ICD-10-CM

## 2023-03-06 DIAGNOSIS — I10 Essential (primary) hypertension: Secondary | ICD-10-CM | POA: Diagnosis not present

## 2023-03-06 NOTE — Progress Notes (Signed)
Established Patient Office Visit  Subjective:  Patient ID: Sheila Mitchell, female    DOB: 03-Jun-1940  Age: 82 y.o. MRN: [id number]  Chief Complaint  Patient presents with   Follow-up    6 month follow up    Patient in office for 6 month follow up. Patient doing well. No new complaints today. Participating in boxing weekly. Discussed recent lab work. Labs all within normal limits.     No other concerns at this time.   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, clear margins   Heart disease    Hypertension    Leaky heart valve    Parkinson disease (HCC)    Personal history of radiation therapy    2021, left breast ca   Ruptured disc, thoracic    Tremor     Past Surgical History:  Procedure Laterality Date   ABDOMINAL HYSTERECTOMY     complete   APPENDECTOMY     at the same time of hysterectomy   BASAL CELL CARCINOMA EXCISION     nose   BREAST BIOPSY Left 11/30/2019   [place] bx, DCIS   BREAST BIOPSY Left 12/05/2021   Stereo Bx, Ribbon Clip, fat necrosis   BREAST LUMPECTOMY Left 12/29/2019   Procedure: BREAST LUMPECTOMY;  Surgeon: Earline Mayotte, MD;  Location: [facility] ORS;  Service: General;  Laterality: Left;   CHOLECYSTECTOMY     EYE SURGERY  2021   TONSILLECTOMY      Social History   Socioeconomic History   Marital status: Widowed    Spouse name: Not on file   Number of children: 3   Years of education: one year college   Highest education level: Not on file  Occupational History   Occupation: Retired  Tobacco Use   Smoking status: Never   Smokeless tobacco: Never  Vaping Use   Vaping status: Never Used  Substance and Sexual Activity   Alcohol use: Not Currently   Drug use: Never   Sexual activity: Not Currently  Other Topics Concern   Not on file  Social History Narrative   Lives at home alone.   Right-handed.   2 cups caffeine daily.   Social  Drivers of Corporate investment banker Strain: Not on file  Food Insecurity: Not on file  Transportation Needs: Not on file  Physical Activity: Not on file  Stress: Not on file  Social Connections: Not on file  Intimate Partner Violence: Not on file    Family History  Problem Relation Age of Onset   COPD Mother    Heart disease Father    Diabetes Paternal Grandmother    Breast cancer Neg Hx     No Known Allergies  Outpatient Medications Prior to Visit  Medication Sig   anastrozole (ARIMIDEX) 1 MG tablet Take 1 tablet (1 mg total) by mouth daily.   aspirin EC 81 MG tablet Take 81 mg by mouth daily.   Calcium Carb-Cholecalciferol (CALCIUM + D3 PO) Take 1 tablet by mouth in the morning and at bedtime. Calcium 750 mg with D3 12.5 mg - 500 UI   carbidopa-levodopa (SINEMET IR) 25-100 MG tablet Take 1 tablet by mouth 3 (three) times daily.   cholecalciferol (VITAMIN D3) 25 MCG (1000 UNIT) tablet Take 1,000 Units by mouth daily.   Coenzyme Q10 (CO Q 10 PO) Take 200 mg  by mouth daily.    lisinopril (ZESTRIL) 5 MG tablet TAKE 1 TABLET DAILY   Multiple Vitamins-Minerals (CENTRUM ADULT PO) Take by mouth.   Omega-3 Fatty Acids (FISH OIL) 1200 MG CAPS Take 1 capsule by mouth daily.   rasagiline (AZILECT) 1 MG TABS tablet Take 1 tablet (1 mg total) by mouth daily.   simvastatin (ZOCOR) 20 MG tablet TAKE 1 TABLET AT BEDTIME   torsemide (DEMADEX) 20 MG tablet TAKE 1 TABLET BY MOUTH EVERY DAY   UNABLE TO FIND Take 3 capsules by mouth daily. Med Name: Balance of Ashby Dawes Supplement   No facility-administered medications prior to visit.    Review of Systems  Constitutional: Negative.   HENT: Negative.    Eyes: Negative.   Respiratory: Negative.  Negative for shortness of breath.   Cardiovascular: Negative.  Negative for chest pain.  Gastrointestinal: Negative.  Negative for abdominal pain, constipation and diarrhea.  Genitourinary: Negative.   Musculoskeletal:  Negative for joint pain and  myalgias.  Skin: Negative.   Neurological: Negative.  Negative for dizziness and headaches.  Endo/Heme/Allergies: Negative.   All other systems reviewed and are negative.      Objective:   BP 128/78 (BP Location: Left Arm, Patient Position: Sitting, Cuff Size: Large)   Pulse 77   Ht 5\' 1"  (1.549 m)   Wt 171 lb 12.8 oz (77.9 kg)   SpO2 99%   BMI 32.46 kg/m   Vitals:   03/06/23 1328 03/06/23 1344  BP: (!) 146/86 128/78  Pulse: 77   Height: 5\' 1"  (1.549 m)   Weight: 171 lb 12.8 oz (77.9 kg)   SpO2: 99%   BMI (Calculated): 32.48     Physical Exam Vitals and nursing note reviewed.  Constitutional:      Appearance: Normal appearance. She is normal weight.  HENT:     Head: Normocephalic and atraumatic.     Nose: Nose normal.     Mouth/Throat:     Mouth: Mucous membranes are moist.  Eyes:     Extraocular Movements: Extraocular movements intact.     Conjunctiva/sclera: Conjunctivae normal.     Pupils: Pupils are equal, round, and reactive to light.  Cardiovascular:     Rate and Rhythm: Normal rate and regular rhythm.     Pulses: Normal pulses.     Heart sounds: Normal heart sounds.  Pulmonary:     Effort: Pulmonary effort is normal.     Breath sounds: Normal breath sounds.  Abdominal:     General: Abdomen is flat. Bowel sounds are normal.     Palpations: Abdomen is soft.  Musculoskeletal:        General: Normal range of motion.     Cervical back: Normal range of motion.  Skin:    General: Skin is warm and dry.  Neurological:     General: No focal deficit present.     Mental Status: She is alert and oriented to person, place, and time.  Psychiatric:        Mood and Affect: Mood normal.        Behavior: Behavior normal.        Thought Content: Thought content normal.        Judgment: Judgment normal.      No results found for any visits on 03/06/23.  Recent Results (from the past 2160 hours)  Lipid Profile     Status: None   Collection Time: 03/03/23  10:45 AM  Result Value Ref Range   Cholesterol,  Total 158 100 - 199 mg/dL   Triglycerides 60 0 - 149 mg/dL   HDL 75 >43 mg/dL   VLDL Cholesterol Cal 12 5 - 40 mg/dL   LDL Chol Calc (NIH) 71 0 - 99 mg/dL   Chol/HDL Ratio 2.1 0.0 - 4.4 ratio    Comment:                                   T. Chol/HDL Ratio                                             Men  Women                               1/2 Avg.Risk  3.4    3.3                                   Avg.Risk  5.0    4.4                                2X Avg.Risk  9.6    7.1                                3X Avg.Risk 23.4   11.0   CMP14+EGFR     Status: None   Collection Time: 03/03/23 10:45 AM  Result Value Ref Range   Glucose 84 70 - 99 mg/dL   BUN 13 8 - 27 mg/dL   Creatinine, Ser 3.29 0.57 - 1.00 mg/dL   eGFR 86 >51 OA/CZY/6.06   BUN/Creatinine Ratio 19 12 - 28   Sodium 142 134 - 144 mmol/L   Potassium 4.5 3.5 - 5.2 mmol/L   Chloride 106 96 - 106 mmol/L   CO2 23 20 - 29 mmol/L   Calcium 9.1 8.7 - 10.3 mg/dL   Total Protein 6.1 6.0 - 8.5 g/dL   Albumin 3.9 3.7 - 4.7 g/dL   Globulin, Total 2.2 1.5 - 4.5 g/dL   Bilirubin Total 0.3 0.0 - 1.2 mg/dL   Alkaline Phosphatase 72 44 - 121 IU/L   AST 20 0 - 40 IU/L   ALT 8 0 - 32 IU/L  TSH     Status: None   Collection Time: 03/03/23 10:45 AM  Result Value Ref Range   TSH 0.880 0.450 - 4.500 uIU/mL  Hemoglobin A1c     Status: None   Collection Time: 03/03/23 10:45 AM  Result Value Ref Range   Hgb A1c MFr Bld 5.4 4.8 - 5.6 %    Comment:          Prediabetes: 5.7 - 6.4          Diabetes: >6.4          Glycemic control for adults with diabetes: <7.0    Est. average glucose Bld gHb Est-mCnc 108 mg/dL      Assessment & Plan:  Continue same medications.   Problem List Items Addressed This Visit       Cardiovascular and Mediastinum   Hypertension -  Primary     Other   Hyperlipemia   Obesity, Class I, BMI 30-34.9    Return in about 6 months (around 09/04/2023) for with  fasting labs prior.   Total time spent: 25 minutes  [employer], NP  03/06/2023   This document may have been prepared by Dragon Voice Recognition software and as such may include unintentional dictation errors.

## 2023-03-23 ENCOUNTER — Other Ambulatory Visit: Payer: Self-pay | Admitting: Cardiovascular Disease

## 2023-03-23 DIAGNOSIS — E782 Mixed hyperlipidemia: Secondary | ICD-10-CM

## 2023-03-23 DIAGNOSIS — I10 Essential (primary) hypertension: Secondary | ICD-10-CM

## 2023-04-08 NOTE — Progress Notes (Deleted)
Patient: Sheila Mitchell Date of Birth: [birth year]  Reason for Visit: Follow up History from: Patient Primary Neurologist: Terrace Arabia  ASSESSMENT AND PLAN 83 y.o. year old female   1.  Idiopathic Parkinson's disease  -Continues to do very well, commended on her commitment to rock steady boxing -Continue Sinemet 25/100 mg 1 tablet 3 times daily, Azilect 1 mg daily -Recommended continue exercise -Follow-up 6 months or sooner if needed  HISTORY   Sheila Mitchell is a 83 years old female, seen in request by her primary care NP Orson Eva for evaluation of Parkinson's disease.  Initial evaluation was on April 06, 2018    She has past medical history of hypertension, hyperlipidemia.   Patient reported a history of right hand tremor since summer 2019, smaller print with prolonged writing, she also noticed mild gait difficulty, difficulty initiate gait when first get up, unsteady gait, dragging her right leg across the floor, initially attributed to her hip, low back problem, had physical therapy which was helpful, also reported MRI of lumbar and thoracic spine by [facility] orthopedic surgeon reviewed degenerative disc disease, is planning on to have epidural injection.   She had decreased sense of smell for more than 10 years, denied constipation, occasionally orthostatic dizziness, tends to restless when sleep,   MRI of the brain in February 2020, showed no significant abnormalities, symptoms of PD include: tremor of right hand, gait difficulty, decreased sense of smell, restless sleeping    She joined a Parkinson's disease exercise group, she goes 3 times a week to do boxing, exercise.  She has been lifting weights.    UPDATE July 08 2019: She complains of increased gait abnormality, freeze difficulty moving her left leg, increased left hand tremor,   Update March 02, 2020 SS: When last seen, Sinemet 25/100 mg 3 times daily was started, referred to physical therapy due to  worsening symptoms, gait abnormality.   Currently taking Sinemet 9:30 am, 3:45 pm, 10:45 pm, notes wearing off effect, feeling tired around 2 and 3 afternoon, does her exercise at 4:00 is hard for her.  Just finished radiation for breast cancer, has started oral chemo Anastrozole. Hopes to get back to her boxing, once cleared by oncologist.  Has noted big improvement with Sinemet, more confident in walking, less tremor to left hand, less fatigue.  No falls.   Update August 31, 2020 SS: Back to boxing 3 times a week, left hip bursitis got a steroid shot is better, did some PT for low back pain, has been really good overall, 5 more sessions. PD feels stable. Taking Sinemet 25-100 mg 3 times daily 9:30 AM, 2:30, 7:30 PM, sees oncologist for breast cancer history coming up, remains on Arimidex. No problems getting out of chair. Hardly every tremor in left hand.    UPDATE Feb 26 2021: Dr. Terrace Arabia  She is overall doing very well, taking Sinemet 25/100 mg twice daily, also exercises regularly, participate in rock boxing   Update September 04, 2021 SS: Doing great today, on Sinemet 25/100, 1 tablets 3 times daily, 9:30, 2:30, 7:30. Dr. Terrace Arabia added Azilect last visit, no issues or change, does cost $40/month this is ok. Does rock steady boxing 3 times a week, got new sneakers today. Feels smell and handwriting has improved.  Had a fall back in February visiting her sister in [place], had to get stitches to left eyebrow, tripped down stairs navigating her luggage.   Update March 21, 2022 SS: Via [facility]V, Has seen good  improvement with her walking with her boxing  classes. Remains on sinemet 25/100 9:30 2:30 7:30. On Azilect. No freezing. No recent falls.  Very pleased.   Update October 10, 2022 SS: Doing [employer] 3 times weekly. Is doing great. On Sinemet 25/100 mg 3 times daily, Azilect 1 mg daily. No falls, getting around well. Next sinemet due to 2:30 (exam at 1:30).  Update April 09, 2023 SS:   REVIEW OF SYSTEMS: Out  of a complete 14 system review of symptoms, the patient complains only of the following symptoms, and all other reviewed systems are negative.  See HPI  ALLERGIES: No Known Allergies  HOME MEDICATIONS: Outpatient Medications Prior to Visit  Medication Sig Dispense Refill   anastrozole (ARIMIDEX) 1 MG tablet Take 1 tablet (1 mg total) by mouth daily. 90 tablet 1   aspirin EC 81 MG tablet Take 81 mg by mouth daily.     Calcium Carb-Cholecalciferol (CALCIUM + D3 PO) Take 1 tablet by mouth in the morning and at bedtime. Calcium 750 mg with D3 12.5 mg - 500 UI     carbidopa-levodopa (SINEMET IR) 25-100 MG tablet Take 1 tablet by mouth 3 (three) times daily. 270 tablet 4   cholecalciferol (VITAMIN D3) 25 MCG (1000 UNIT) tablet Take 1,000 Units by mouth daily.     Coenzyme Q10 (CO Q 10 PO) Take 200 mg by mouth daily.      lisinopril (ZESTRIL) 5 MG tablet TAKE 1 TABLET DAILY 90 tablet 0   Multiple Vitamins-Minerals (CENTRUM ADULT PO) Take by mouth.     Omega-3 Fatty Acids (FISH OIL) 1200 MG CAPS Take 1 capsule by mouth daily.     rasagiline (AZILECT) 1 MG TABS tablet Take 1 tablet (1 mg total) by mouth daily. 90 tablet 3   simvastatin (ZOCOR) 20 MG tablet TAKE 1 TABLET AT BEDTIME 90 tablet 0   torsemide (DEMADEX) 20 MG tablet TAKE 1 TABLET BY MOUTH EVERY DAY 90 tablet 0   UNABLE TO FIND Take 3 capsules by mouth daily. Med Name: Balance of Ashby Dawes Supplement     No facility-administered medications prior to visit.   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, clear margins   Heart disease    Hypertension    Leaky heart valve    Parkinson disease (HCC)    Personal history of radiation therapy    2021, left breast ca   Ruptured disc, thoracic    Tremor     PAST SURGICAL HISTORY: Past Surgical History:  Procedure Laterality Date   ABDOMINAL HYSTERECTOMY     complete    APPENDECTOMY     at the same time of hysterectomy   BASAL CELL CARCINOMA EXCISION     nose   BREAST BIOPSY Left 11/30/2019   [place] bx, DCIS   BREAST BIOPSY Left 12/05/2021   Stereo Bx, Ribbon Clip, fat necrosis   BREAST LUMPECTOMY Left 12/29/2019   Procedure: BREAST LUMPECTOMY;  Surgeon: Earline Mayotte, MD;  Location: [facility] ORS;  Service: General;  Laterality: Left;   CHOLECYSTECTOMY     EYE SURGERY  2021   TONSILLECTOMY     FAMILY HISTORY: Family History  Problem Relation Age of Onset   COPD Mother    Heart disease Father    Diabetes Paternal Grandmother    Breast cancer Neg Hx  SOCIAL HISTORY: Social History   Socioeconomic History   Marital status: Widowed    Spouse name: Not on file   Number of children: 3   Years of education: one year college   Highest education level: Not on file  Occupational History   Occupation: Retired  Tobacco Use   Smoking status: Never   Smokeless tobacco: Never  Vaping Use   Vaping status: Never Used  Substance and Sexual Activity   Alcohol use: Not Currently   Drug use: Never   Sexual activity: Not Currently  Other Topics Concern   Not on file  Social History Narrative   Lives at home alone.   Right-handed.   2 cups caffeine daily.   Social Drivers of Corporate investment banker Strain: Not on file  Food Insecurity: Not on file  Transportation Needs: Not on file  Physical Activity: Not on file  Stress: Not on file  Social Connections: Not on file  Intimate Partner Violence: Not on file    PHYSICAL EXAM  There were no vitals filed for this visit.  There is no height or weight on file to calculate BMI.  Generalized: Well developed, in no acute distress  Neurological examination  Mentation: Alert oriented to time, place, history taking. Follows all commands speech and language fluent Cranial nerve II-XII: Pupils were equal round reactive to light. Extraocular movements were full, visual field were full on  confrontational test. Facial sensation and strength were normal. Head turning and shoulder shrug  were normal and symmetric. Motor: The motor testing reveals 5 over 5 strength of all 4 extremities. Good symmetric motor tone is noted throughout.  Occasional intermittent resting tremors of the left hand noted.  Mild bradykinesia to left upper with hand taps. Sensory: Sensory testing is intact to soft touch on all 4 extremities. No evidence of extinction is noted.  Coordination: Cerebellar testing reveals good finger-nose-finger and heel-to-shin bilaterally.  Gait and station: Gait is normal.  Conscious of exaggerated arm swing Reflexes: Deep tendon reflexes are symmetric and normal bilaterally.   DIAGNOSTIC DATA (LABS, IMAGING, TESTING) - I reviewed patient records, labs, notes, testing and imaging myself where available.  Lab Results  Component Value Date   WBC 6.3 07/15/2022   HGB 13.6 07/15/2022   HCT 42.0 07/15/2022   MCV 89 07/15/2022   PLT 235 07/15/2022      Component Value Date/Time   NA 142 03/03/2023 1045   K 4.5 03/03/2023 1045   CL 106 03/03/2023 1045   CO2 23 03/03/2023 1045   GLUCOSE 84 03/03/2023 1045   GLUCOSE 88 05/02/2020 1044   BUN 13 03/03/2023 1045   CREATININE 0.70 03/03/2023 1045   CALCIUM 9.1 03/03/2023 1045   PROT 6.1 03/03/2023 1045   ALBUMIN 3.9 03/03/2023 1045   AST 20 03/03/2023 1045   ALT 8 03/03/2023 1045   ALKPHOS 72 03/03/2023 1045   BILITOT 0.3 03/03/2023 1045   GFRNONAA >60 05/02/2020 1044   Lab Results  Component Value Date   CHOL 158 03/03/2023   HDL 75 03/03/2023   LDLCALC 71 03/03/2023   TRIG 60 03/03/2023   CHOLHDL 2.1 03/03/2023   Lab Results  Component Value Date   HGBA1C 5.4 03/03/2023   No results found for: "VITAMINB12" Lab Results  Component Value Date   TSH 0.880 03/03/2023    Margie Ege, AGNP-C, DNP 04/08/2023, 2:51 PM [facility] [address] [place], [place] [address] [phone]

## 2023-04-09 ENCOUNTER — Telehealth: Payer: Self-pay | Admitting: Neurology

## 2023-04-09 ENCOUNTER — Ambulatory Visit: Payer: Medicare HMO | Admitting: Neurology

## 2023-04-09 NOTE — Telephone Encounter (Signed)
R/s due to weather conditions

## 2023-04-16 ENCOUNTER — Other Ambulatory Visit: Payer: Self-pay | Admitting: Oncology

## 2023-04-22 ENCOUNTER — Other Ambulatory Visit: Payer: Self-pay

## 2023-04-22 MED ORDER — ANASTROZOLE 1 MG PO TABS: 1.0000 mg | ORAL_TABLET | Freq: Every day | ORAL | 1 refills | Status: DC

## 2023-04-29 ENCOUNTER — Ambulatory Visit: Payer: Medicare Other | Admitting: Neurology

## 2023-04-29 ENCOUNTER — Encounter: Payer: Self-pay | Admitting: Neurology

## 2023-04-29 ENCOUNTER — Other Ambulatory Visit: Payer: Self-pay

## 2023-04-29 VITALS — BP 160/81 | HR 78 | Ht 60.0 in | Wt 171.7 lb

## 2023-04-29 DIAGNOSIS — G20A1 Parkinson's disease without dyskinesia, without mention of fluctuations: Secondary | ICD-10-CM | POA: Diagnosis not present

## 2023-04-29 MED ORDER — CARBIDOPA-LEVODOPA 25-100 MG PO TABS: 1.0000 | ORAL_TABLET | Freq: Three times a day (TID) | ORAL | 4 refills | Status: DC

## 2023-04-29 NOTE — Progress Notes (Signed)
Patient: Sheila Mitchell Date of Birth: 1941/02/19  Reason for Visit: Follow up History from: Patient Primary Neurologist: Terrace Arabia  ASSESSMENT AND PLAN 83 y.o. year old female   1.  Idiopathic Parkinson's disease  -Continues to do very well, very committed to her exercise program  -Continue Sinemet 25/100 mg 1 tablet 3 times daily, Azilect 1 mg daily -Recommended continue exercise -BP is high, keep log and discuss with primary care  -Follow-up 6 months or sooner if needed with Dr. Terrace Arabia to rotate every few visits with me  HISTORY   Sheila Mitchell is a 83 years old female, seen in request by her primary care NP Orson Eva for evaluation of Parkinson's disease.  Initial evaluation was on April 06, 2018    She has past medical history of hypertension, hyperlipidemia.   Patient reported a history of right hand tremor since summer 2019, smaller print with prolonged writing, she also noticed mild gait difficulty, difficulty initiate gait when first get up, unsteady gait, dragging her right leg across the floor, initially attributed to her hip, low back problem, had physical therapy which was helpful, also reported MRI of lumbar and thoracic spine by [facility] orthopedic surgeon reviewed degenerative disc disease, is planning on to have epidural injection.   She had decreased sense of smell for more than 10 years, denied constipation, occasionally orthostatic dizziness, tends to restless when sleep,   MRI of the brain in February 2020, showed no significant abnormalities, symptoms of PD include: tremor of right hand, gait difficulty, decreased sense of smell, restless sleeping    She joined a Parkinson's disease exercise group, she goes 3 times a week to do boxing, exercise.  She has been lifting weights.    UPDATE July 08 2019: She complains of increased gait abnormality, freeze difficulty moving her left leg, increased left hand tremor,   Update March 02, 2020 SS: When last  seen, Sinemet 25/100 mg 3 times daily was started, referred to physical therapy due to worsening symptoms, gait abnormality.   Currently taking Sinemet 9:30 am, 3:45 pm, 10:45 pm, notes wearing off effect, feeling tired around 2 and 3 afternoon, does her exercise at 4:00 is hard for her.  Just finished radiation for breast cancer, has started oral chemo Anastrozole. Hopes to get back to her boxing, once cleared by oncologist.  Has noted big improvement with Sinemet, more confident in walking, less tremor to left hand, less fatigue.  No falls.   Update August 31, 2020 SS: Back to boxing 3 times a week, left hip bursitis got a steroid shot is better, did some PT for low back pain, has been really good overall, 5 more sessions. PD feels stable. Taking Sinemet 25-100 mg 3 times daily 9:30 AM, 2:30, 7:30 PM, sees oncologist for breast cancer history coming up, remains on Arimidex. No problems getting out of chair. Hardly every tremor in left hand.    UPDATE Feb 26 2021: Dr. Terrace Arabia  She is overall doing very well, taking Sinemet 25/100 mg twice daily, also exercises regularly, participate in rock boxing   Update September 04, 2021 SS: Doing great today, on Sinemet 25/100, 1 tablets 3 times daily, 9:30, 2:30, 7:30. Dr. Terrace Arabia added Azilect last visit, no issues or change, does cost $40/month this is ok. Does rock steady boxing 3 times a week, got new sneakers today. Feels smell and handwriting has improved.  Had a fall back in February visiting her sister in [place], had to get stitches to  left eyebrow, tripped down stairs navigating her luggage.   Update March 21, 2022 SS: Via [facility], Has seen good improvement with her walking with her boxing  classes. Remains on sinemet 25/100 9:30 2:30 7:30. On Azilect. No freezing. No recent falls.  Very pleased.   Update October 10, 2022 SS: Doing [employer] 3 times weekly. Is doing great. On Sinemet 25/100 mg 3 times daily, Azilect 1 mg daily. No falls, getting around well. Next sinemet  due to 2:30 (exam at 1:30).  Update April 29, 2023 SS: Remains in boxing 3 times a week. On Sinemet 25/100 mg 3 times daily, Azilect 1 mg daily.. No falls, no tremor, no freezing. Some drooling at night. Very very subtle resting tremor to left hand noted, due for medication at 2:30 PM. Only trouble she has is rocking to get our of chair. Has a cold today.   REVIEW OF SYSTEMS: Out of a complete 14 system review of symptoms, the patient complains only of the following symptoms, and all other reviewed systems are negative.  See HPI  ALLERGIES: No Known Allergies  HOME MEDICATIONS: Outpatient Medications Prior to Visit  Medication Sig Dispense Refill   anastrozole (ARIMIDEX) 1 MG tablet Take 1 tablet (1 mg total) by mouth daily. 90 tablet 1   aspirin EC 81 MG tablet Take 81 mg by mouth daily.     Calcium Carb-Cholecalciferol (CALCIUM + D3 PO) Take 1 tablet by mouth in the morning and at bedtime. Calcium 750 mg with D3 12.5 mg - 500 UI     carbidopa-levodopa (SINEMET IR) 25-100 MG tablet Take 1 tablet by mouth 3 (three) times daily. 270 tablet 4   cholecalciferol (VITAMIN D3) 25 MCG (1000 UNIT) tablet Take 1,000 Units by mouth daily.     Coenzyme Q10 (CO Q 10 PO) Take 200 mg by mouth daily.      lisinopril (ZESTRIL) 5 MG tablet TAKE 1 TABLET DAILY 90 tablet 0   Multiple Vitamins-Minerals (CENTRUM ADULT PO) Take by mouth.     Omega-3 Fatty Acids (FISH OIL) 1200 MG CAPS Take 1 capsule by mouth daily.     rasagiline (AZILECT) 1 MG TABS tablet Take 1 tablet (1 mg total) by mouth daily. 90 tablet 3   simvastatin (ZOCOR) 20 MG tablet TAKE 1 TABLET AT BEDTIME 90 tablet 0   UNABLE TO FIND Take 3 capsules by mouth daily. Med Name: Balance of Ashby Dawes Supplement     torsemide (DEMADEX) 20 MG tablet TAKE 1 TABLET BY MOUTH EVERY DAY 90 tablet 0   No facility-administered medications prior to visit.   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, clear margins   Heart disease    Hypertension    Leaky heart valve    Parkinson disease (HCC)    Personal history of radiation therapy    2021, left breast ca   Ruptured disc, thoracic    Tremor     PAST SURGICAL HISTORY: Past Surgical History:  Procedure Laterality Date   ABDOMINAL HYSTERECTOMY     complete   APPENDECTOMY     at the same time of hysterectomy   BASAL CELL CARCINOMA EXCISION     nose   BREAST BIOPSY Left 11/30/2019   [place] bx, DCIS   BREAST BIOPSY Left 12/05/2021   Stereo Bx, Ribbon Clip, fat necrosis  BREAST LUMPECTOMY Left 12/29/2019   Procedure: BREAST LUMPECTOMY;  Surgeon: Earline Mayotte, MD;  Location: [facility] ORS;  Service: General;  Laterality: Left;   CHOLECYSTECTOMY     EYE SURGERY  2021   TONSILLECTOMY     FAMILY HISTORY: Family History  Problem Relation Age of Onset   COPD Mother    Heart disease Father    Diabetes Paternal Grandmother    Breast cancer Neg Hx     SOCIAL HISTORY: Social History   Socioeconomic History   Marital status: Widowed    Spouse name: Not on file   Number of children: 3   Years of education: one year college   Highest education level: Not on file  Occupational History   Occupation: Retired  Tobacco Use   Smoking status: Never   Smokeless tobacco: Never  Vaping Use   Vaping status: Never Used  Substance and Sexual Activity   Alcohol use: Not Currently   Drug use: Never   Sexual activity: Not Currently  Other Topics Concern   Not on file  Social History Narrative   Lives at home alone.   Right-handed.   2 cups caffeine daily.   Social Drivers of Corporate investment banker Strain: Not on file  Food Insecurity: Not on file  Transportation Needs: Not on file  Physical Activity: Not on file  Stress: Not on file  Social Connections: Not on file  Intimate Partner Violence: Not on file   PHYSICAL EXAM  Vitals:   04/29/23 1321  BP: (!)  160/81  Pulse: 78  Weight: 171 lb 11.8 oz (77.9 kg)  Height: 5' (1.524 m)    Body mass index is 33.54 kg/m.  Generalized: Well developed, in no acute distress, is very positive  Neurological examination  Mentation: Alert oriented to time, place, history taking. Follows all commands speech and language fluent Cranial nerve II-XII: Pupils were equal round reactive to light. Extraocular movements were full, visual field were full on confrontational test. Facial sensation and strength were normal. Head turning and shoulder shrug  were normal and symmetric. Motor: The motor testing reveals 5 over 5 strength of all 4 extremities. Good symmetric motor tone is noted throughout.  Occasional intermittent resting tremors of the left hand noted.  Mild bradykinesia to left upper with hand taps. Sensory: Sensory testing is intact to soft touch on all 4 extremities. No evidence of extinction is noted.  Coordination: Cerebellar testing reveals good finger-nose-finger and heel-to-shin bilaterally.  Gait and station: Gait is normal.  Conscious of exaggerated arm swing. Turns are steady. Reflexes: Deep tendon reflexes are symmetric and normal bilaterally.   DIAGNOSTIC DATA (LABS, IMAGING, TESTING) - I reviewed patient records, labs, notes, testing and imaging myself where available.  Lab Results  Component Value Date   WBC 6.3 07/15/2022   HGB 13.6 07/15/2022   HCT 42.0 07/15/2022   MCV 89 07/15/2022   PLT 235 07/15/2022      Component Value Date/Time   NA 142 03/03/2023 1045   K 4.5 03/03/2023 1045   CL 106 03/03/2023 1045   CO2 23 03/03/2023 1045   GLUCOSE 84 03/03/2023 1045   GLUCOSE 88 05/02/2020 1044   BUN 13 03/03/2023 1045   CREATININE 0.70 03/03/2023 1045   CALCIUM 9.1 03/03/2023 1045   PROT 6.1 03/03/2023 1045   ALBUMIN 3.9 03/03/2023 1045   AST 20 03/03/2023 1045   ALT 8 03/03/2023 1045   ALKPHOS 72 03/03/2023 1045   BILITOT 0.3 03/03/2023  1045   GFRNONAA >60 05/02/2020 1044    Lab Results  Component Value Date   CHOL 158 03/03/2023   HDL 75 03/03/2023   LDLCALC 71 03/03/2023   TRIG 60 03/03/2023   CHOLHDL 2.1 03/03/2023   Lab Results  Component Value Date   HGBA1C 5.4 03/03/2023   No results found for: "VITAMINB12" Lab Results  Component Value Date   TSH 0.880 03/03/2023   Margie Ege, AGNP-C, DNP 04/29/2023, 1:44 PM [facility] [address], [address] [place], [place] [address] [phone]

## 2023-04-29 NOTE — Patient Instructions (Signed)
Great to see you today. Continue current medications. Continue exercise. Follow up in 6 months with Dr. Terrace Arabia. Thanks!!

## 2023-04-30 ENCOUNTER — Inpatient Hospital Stay: Payer: Medicare Other | Admitting: Oncology

## 2023-05-15 ENCOUNTER — Ambulatory Visit
Admission: RE | Admit: 2023-05-15 | Discharge: 2023-05-15 | Disposition: A | Discharge status: Home / Self Care | Payer: Medicare Other | Source: Ambulatory Visit | Attending: Radiation Oncology | Admitting: Radiation Oncology

## 2023-05-15 ENCOUNTER — Encounter: Payer: Self-pay | Admitting: Radiation Oncology

## 2023-05-15 VITALS — BP 128/78 | HR 83 | Temp 98.5°F | Resp 16 | Wt 174.0 lb

## 2023-05-15 DIAGNOSIS — Z923 Personal history of irradiation: Secondary | ICD-10-CM | POA: Insufficient documentation

## 2023-05-15 DIAGNOSIS — Z170 Estrogen receptor positive status [ER+]: Secondary | ICD-10-CM | POA: Insufficient documentation

## 2023-05-15 DIAGNOSIS — D0512 Intraductal carcinoma in situ of left breast: Secondary | ICD-10-CM | POA: Insufficient documentation

## 2023-05-15 DIAGNOSIS — Z79811 Long term (current) use of aromatase inhibitors: Secondary | ICD-10-CM | POA: Diagnosis not present

## 2023-05-15 NOTE — Progress Notes (Signed)
 Radiation Oncology Follow up Note  Name: Sheila Mitchell   Date:   05/15/2023 MRN:  [id number] DOB: 19-Jan-1941    This 83 y.o. 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  Seen today in routine follow-up she is doing well.  She specifically denies breast tenderness cough or bone pain.  She had mammograms back in.  September which I have reviewed were BI-RADS 2 benign.  She is current on Arimidex tolerating it well without side effect.  COMPLICATIONS OF TREATMENT: none  FOLLOW UP COMPLIANCE: keeps appointments   PHYSICAL EXAM:  BP 128/78   Pulse 83   Temp 98.5 F (36.9 C) (Temporal)   Resp 16   Wt 174 lb (78.9 kg)   BMI 33.98 kg/m  Lungs are clear to A&P cardiac examination essentially unremarkable with regular rate and rhythm. No dominant mass or nodularity is noted in either breast in 2 positions examined. Incision is well-healed. No axillary or supraclavicular adenopathy is appreciated. Cosmetic result is excellent.  Well-developed well-nourished patient in NAD. HEENT reveals PERLA, EOMI, discs not visualized.  Oral cavity is clear. No oral mucosal lesions are identified. Neck is clear without evidence of cervical or supraclavicular adenopathy. Lungs are clear to A&P. Cardiac examination is essentially unremarkable with regular rate and rhythm without murmur rub or thrill. Abdomen is benign with no organomegaly or masses noted. Motor sensory and DTR levels are equal and symmetric in the upper and lower extremities. Cranial nerves II through XII are grossly intact. Proprioception is intact. No peripheral adenopathy or edema is identified. No motor or sensory levels are  noted. Crude visual fields are within normal range.  RADIOLOGY RESULTS: Mammograms reviewed compatible with above-stated findings  PLAN: Present time patient is now out over 3-1/2 years from whole breast radiation and turning follow-up care over to medical oncology.  I would be happy to reevaluate this patient in time should that be indicated.  She continues on t Arimidex without side effect.  Patient is to call with any concerns.  I would like to take this opportunity to thank you for allowing me to participate in the care of your patient.Carmina Miller, MD

## 2023-06-26 ENCOUNTER — Telehealth: Payer: Self-pay

## 2023-06-26 NOTE — Telephone Encounter (Signed)
 Patient called stating that she would like either a video visit or in person visit with Dr. Smith Robert to discuss side affects she feels she's having from the anastrozole medication. She states since last Friday, she has been experiencing heartburn and sharp cramps/muscle pain in her left thigh. She also said after doing some investigation online, she's pretty sure the medication is causing this but would still like Dr. Assunta Gambles insight.

## 2023-06-26 NOTE — Telephone Encounter (Signed)
 Video visit next wednesday

## 2023-07-02 ENCOUNTER — Telehealth: Admitting: Oncology

## 2023-07-10 ENCOUNTER — Telehealth: Payer: Self-pay | Admitting: Neurology

## 2023-07-10 MED ORDER — RASAGILINE MESYLATE 1 MG PO TABS: 1.0000 mg | ORAL_TABLET | Freq: Every day | ORAL | 3 refills | Status: DC

## 2023-07-10 NOTE — Telephone Encounter (Signed)
 Pt is asking that her rasagiline  (AZILECT ) 1 MG TABS tablet  now be sent thru [facility] mail order.  Pt has provided their fax# as [phone]

## 2023-07-10 NOTE — Telephone Encounter (Signed)
 Refill sent ot mail order [facility]

## 2023-09-04 ENCOUNTER — Ambulatory Visit: Payer: Medicare HMO | Admitting: Neurology

## 2023-09-11 ENCOUNTER — Ambulatory Visit: Payer: Medicare Other | Admitting: Cardiology

## 2023-09-23 ENCOUNTER — Other Ambulatory Visit

## 2023-09-23 DIAGNOSIS — I10 Essential (primary) hypertension: Secondary | ICD-10-CM

## 2023-09-23 DIAGNOSIS — E782 Mixed hyperlipidemia: Secondary | ICD-10-CM

## 2023-09-23 DIAGNOSIS — Z1329 Encounter for screening for other suspected endocrine disorder: Secondary | ICD-10-CM

## 2023-09-23 DIAGNOSIS — Z131 Encounter for screening for diabetes mellitus: Secondary | ICD-10-CM

## 2023-09-24 ENCOUNTER — Ambulatory Visit: Payer: Self-pay | Admitting: Cardiology

## 2023-09-24 LAB — CMP14+EGFR
ALT: 9 IU/L (ref 0–32)
AST: 16 IU/L (ref 0–40)
Albumin: 4.1 g/dL (ref 3.7–4.7)
Alkaline Phosphatase: 80 IU/L (ref 44–121)
BUN/Creatinine Ratio: 17 (ref 12–28)
BUN: 16 mg/dL (ref 8–27)
Bilirubin Total: 0.4 mg/dL (ref 0.0–1.2)
CO2: 20 mmol/L (ref 20–29)
Calcium: 9.8 mg/dL (ref 8.7–10.3)
Chloride: 103 mmol/L (ref 96–106)
Creatinine, Ser: 0.92 mg/dL (ref 0.57–1.00)
Globulin, Total: 2.7 g/dL (ref 1.5–4.5)
Glucose: 97 mg/dL (ref 70–99)
Potassium: 4.5 mmol/L (ref 3.5–5.2)
Sodium: 139 mmol/L (ref 134–144)
Total Protein: 6.8 g/dL (ref 6.0–8.5)
eGFR: 62 mL/min/1.73 (ref >59)

## 2023-09-24 LAB — LIPID PANEL
Chol/HDL Ratio: 2.2 ratio (ref 0.0–4.4)
Cholesterol, Total: 156 mg/dL (ref 100–199)
HDL: 71 mg/dL (ref >39)
LDL Chol Calc (NIH): 71 mg/dL (ref 0–99)
Triglycerides: 74 mg/dL (ref 0–149)
VLDL Cholesterol Cal: 14 mg/dL (ref 5–40)

## 2023-09-24 LAB — HEMOGLOBIN A1C
Est. average glucose Bld gHb Est-mCnc: 108 mg/dL
Hgb A1c MFr Bld: 5.4 % (ref 4.8–5.6)

## 2023-09-24 LAB — TSH: TSH: 1.94 u[IU]/mL (ref 0.450–4.500)

## 2023-09-25 ENCOUNTER — Ambulatory Visit: Admitting: Cardiology

## 2023-10-08 ENCOUNTER — Telehealth: Payer: Self-pay

## 2023-10-08 ENCOUNTER — Encounter: Payer: Self-pay | Admitting: Surgery

## 2023-10-08 DIAGNOSIS — Z1231 Encounter for screening mammogram for malignant neoplasm of breast: Secondary | ICD-10-CM

## 2023-10-08 NOTE — Telephone Encounter (Signed)
 Received voicemail from patient 10/08/23 approximately 12:30PM today asking where she should send her mammogram too.  Mentioned Dr. Linnie did the surgery, that she's supposed to see Dr. Melanee and that Dr. Tye saw her the last time when he took over for Dr. Linnie.  No follow up appointment scheduled; patient cancelled last two appointments on 04/30/23 and 07/02/63.  Last mammo visible in Epic dated 11/20/22 reads Annual screening mammogram in 1 year.

## 2023-10-09 NOTE — Telephone Encounter (Signed)
 Another voice message left by patient yesterday 10/08/23 at 2:06PM indicating that she found the information she needed regarding who to forward her mammogram to. No further questions / concerns at this time.

## 2023-10-14 ENCOUNTER — Other Ambulatory Visit: Payer: Self-pay | Admitting: Surgery

## 2023-10-14 DIAGNOSIS — Z1231 Encounter for screening mammogram for malignant neoplasm of breast: Secondary | ICD-10-CM

## 2023-10-15 ENCOUNTER — Encounter: Payer: Self-pay | Admitting: Cardiology

## 2023-10-15 ENCOUNTER — Ambulatory Visit (INDEPENDENT_AMBULATORY_CARE_PROVIDER_SITE_OTHER): Admitting: Cardiology

## 2023-10-15 VITALS — BP 119/88 | HR 73 | Ht 60.0 in | Wt 178.0 lb

## 2023-10-15 DIAGNOSIS — K219 Gastro-esophageal reflux disease without esophagitis: Secondary | ICD-10-CM

## 2023-10-15 DIAGNOSIS — I10 Essential (primary) hypertension: Secondary | ICD-10-CM | POA: Diagnosis not present

## 2023-10-15 DIAGNOSIS — E782 Mixed hyperlipidemia: Secondary | ICD-10-CM

## 2023-10-15 MED ORDER — OMEPRAZOLE 20 MG PO CPDR: 20.0000 mg | DELAYED_RELEASE_CAPSULE | Freq: Every day | ORAL | 1 refills | Status: DC

## 2023-10-15 NOTE — Progress Notes (Signed)
 Established Patient Office Visit  Subjective:  Patient ID: Sheila Mitchell, female    DOB: 1940/04/22  Age: 83 y.o. MRN: [id number]  Chief Complaint  Patient presents with   Follow-up    6 month lab results. Pt is having heartburn .    Patient in office for 6 month follow up, discuss recent lab results. Patient doing well over all. Complains of acid reflux, has been using Rolaids every night. Will add omeprazole  in place of Rolaids.  Discussed recent lab work. Labs all within normal limits.  Continue same medications.     No other concerns at this time.   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, clear margins   Heart disease    Hypertension    Leaky heart valve    Parkinson disease (HCC)    Personal history of radiation therapy    2021, left breast ca   Ruptured disc, thoracic    Tremor     Past Surgical History:  Procedure Laterality Date   ABDOMINAL HYSTERECTOMY     complete   APPENDECTOMY     at the same time of hysterectomy   BASAL CELL CARCINOMA EXCISION     nose   BREAST BIOPSY Left 11/30/2019   us  bx, DCIS   BREAST BIOPSY Left 12/05/2021   Stereo Bx, Ribbon Clip, fat necrosis   BREAST LUMPECTOMY Left 12/29/2019   Procedure: BREAST LUMPECTOMY;  Surgeon: Dessa Reyes ORN, MD;  Location: [facility] ORS;  Service: General;  Laterality: Left;   CHOLECYSTECTOMY     EYE SURGERY  2021   TONSILLECTOMY      Social History   Socioeconomic History   Marital status: Widowed    Spouse name: Not on file   Number of children: 3   Years of education: one year college   Highest education level: Not on file  Occupational History   Occupation: Retired  Tobacco Use   Smoking status: Never   Smokeless tobacco: Never  Vaping Use   Vaping status: Never Used  Substance and Sexual Activity   Alcohol use: Not Currently   Drug use: Never   Sexual activity: Not Currently   Other Topics Concern   Not on file  Social History Narrative   Lives at home alone.   Right-handed.   2 cups caffeine daily.   Social Drivers of Corporate investment banker Strain: Not on file  Food Insecurity: Not on file  Transportation Needs: Not on file  Physical Activity: Not on file  Stress: Not on file  Social Connections: Not on file  Intimate Partner Violence: Not on file    Family History  Problem Relation Age of Onset   COPD Mother    Heart disease Father    Diabetes Paternal Grandmother    Breast cancer Neg Hx     No Known Allergies  Outpatient Medications Prior to Visit  Medication Sig   anastrozole  (ARIMIDEX ) 1 MG tablet Take 1 tablet (1 mg total) by mouth daily.   aspirin EC 81 MG tablet Take 81 mg by mouth daily.   Calcium Carb-Cholecalciferol (CALCIUM + D3 PO) Take 1 tablet by mouth in the morning and at bedtime. Calcium 750 mg with D3 12.5 mg - 500 UI   carbidopa -levodopa  (SINEMET  IR) 25-100 MG tablet Take 1 tablet by mouth 3 (three) times daily.  cholecalciferol (VITAMIN D3) 25 MCG (1000 UNIT) tablet Take 1,000 Units by mouth daily.   Coenzyme Q10 (CO Q 10 PO) Take 200 mg by mouth daily.    lisinopril  (ZESTRIL ) 5 MG tablet TAKE 1 TABLET DAILY   Multiple Vitamins-Minerals (CENTRUM ADULT PO) Take by mouth.   Omega-3 Fatty Acids (FISH OIL) 1200 MG CAPS Take 1 capsule by mouth daily.   rasagiline  (AZILECT ) 1 MG TABS tablet Take 1 tablet (1 mg total) by mouth daily.   simvastatin  (ZOCOR ) 20 MG tablet TAKE 1 TABLET AT BEDTIME   UNABLE TO FIND Take 3 capsules by mouth daily. Med Name: Balance of Lysle Supplement   No facility-administered medications prior to visit.    Review of Systems  Constitutional: Negative.   HENT: Negative.    Eyes: Negative.   Respiratory: Negative.  Negative for shortness of breath.   Cardiovascular: Negative.  Negative for chest pain.  Gastrointestinal:  Positive for heartburn. Negative for abdominal pain, constipation and  diarrhea.  Genitourinary: Negative.   Musculoskeletal:  Negative for joint pain and myalgias.  Skin: Negative.   Neurological: Negative.  Negative for dizziness and headaches.  Endo/Heme/Allergies: Negative.   All other systems reviewed and are negative.      Objective:   BP 119/88   Pulse 73   Ht 5' (1.524 m)   Wt 178 lb (80.7 kg)   SpO2 98%   BMI 34.76 kg/m   Vitals:   10/15/23 1112  BP: 119/88  Pulse: 73  Height: 5' (1.524 m)  Weight: 178 lb (80.7 kg)  SpO2: 98%  BMI (Calculated): 34.76    Physical Exam Vitals and nursing note reviewed.  Constitutional:      Appearance: Normal appearance. She is normal weight.  HENT:     Head: Normocephalic and atraumatic.     Nose: Nose normal.     Mouth/Throat:     Mouth: Mucous membranes are moist.  Eyes:     Extraocular Movements: Extraocular movements intact.     Conjunctiva/sclera: Conjunctivae normal.     Pupils: Pupils are equal, round, and reactive to light.  Cardiovascular:     Rate and Rhythm: Normal rate and regular rhythm.     Pulses: Normal pulses.     Heart sounds: Normal heart sounds.  Pulmonary:     Effort: Pulmonary effort is normal.     Breath sounds: Normal breath sounds.  Abdominal:     General: Abdomen is flat. Bowel sounds are normal.     Palpations: Abdomen is soft.  Musculoskeletal:        General: Normal range of motion.     Cervical back: Normal range of motion.  Skin:    General: Skin is warm and dry.  Neurological:     General: No focal deficit present.     Mental Status: She is alert and oriented to person, place, and time.  Psychiatric:        Mood and Affect: Mood normal.        Behavior: Behavior normal.        Thought Content: Thought content normal.        Judgment: Judgment normal.      No results found for any visits on 10/15/23.  Recent Results (from the past 2160 hours)  Hemoglobin A1c     Status: None   Collection Time: 09/23/23 10:22 AM  Result Value Ref Range    Hgb A1c MFr Bld 5.4 4.8 - 5.6 %    Comment:  Prediabetes: 5.7 - 6.4          Diabetes: >6.4          Glycemic control for adults with diabetes: <7.0    Est. average glucose Bld gHb Est-mCnc 108 mg/dL  TSH     Status: None   Collection Time: 09/23/23 10:22 AM  Result Value Ref Range   TSH 1.940 0.450 - 4.500 uIU/mL  CMP14+EGFR     Status: None   Collection Time: 09/23/23 10:22 AM  Result Value Ref Range   Glucose 97 70 - 99 mg/dL   BUN 16 8 - 27 mg/dL   Creatinine, Ser 9.07 0.57 - 1.00 mg/dL   eGFR 62 >40 fO/fpw/8.26   BUN/Creatinine Ratio 17 12 - 28   Sodium 139 134 - 144 mmol/L   Potassium 4.5 3.5 - 5.2 mmol/L   Chloride 103 96 - 106 mmol/L   CO2 20 20 - 29 mmol/L   Calcium 9.8 8.7 - 10.3 mg/dL   Total Protein 6.8 6.0 - 8.5 g/dL   Albumin 4.1 3.7 - 4.7 g/dL   Globulin, Total 2.7 1.5 - 4.5 g/dL   Bilirubin Total 0.4 0.0 - 1.2 mg/dL   Alkaline Phosphatase 80 44 - 121 IU/L   AST 16 0 - 40 IU/L   ALT 9 0 - 32 IU/L  Lipid Profile     Status: None   Collection Time: 09/23/23 10:22 AM  Result Value Ref Range   Cholesterol, Total 156 100 - 199 mg/dL   Triglycerides 74 0 - 149 mg/dL   HDL 71 >60 mg/dL   VLDL Cholesterol Cal 14 5 - 40 mg/dL   LDL Chol Calc (NIH) 71 0 - 99 mg/dL   Chol/HDL Ratio 2.2 0.0 - 4.4 ratio    Comment:                                   T. Chol/HDL Ratio                                             Men  Women                               1/2 Avg.Risk  3.4    3.3                                   Avg.Risk  5.0    4.4                                2X Avg.Risk  9.6    7.1                                3X Avg.Risk 23.4   11.0       Assessment & Plan:  Continue same medications. Add omeprazole  for acid reflux  Problem List Items Addressed This Visit       Cardiovascular and Mediastinum   Hypertension - Primary     Other   Hyperlipemia    Return in about 6  months (around 04/16/2024) for fasting labs prior.   Total time spent: 25  minutes  [employer], NP  10/15/2023   This document may have been prepared by Dragon Voice Recognition software and as such may include unintentional dictation errors.

## 2023-10-27 ENCOUNTER — Other Ambulatory Visit: Payer: Self-pay | Admitting: Oncology

## 2023-10-28 ENCOUNTER — Ambulatory Visit: Payer: Medicare Other | Admitting: Neurology

## 2023-10-28 ENCOUNTER — Encounter: Payer: Self-pay | Admitting: Neurology

## 2023-10-28 VITALS — BP 153/81 | HR 77 | Ht 60.0 in | Wt 178.5 lb

## 2023-10-28 DIAGNOSIS — G20A1 Parkinson's disease without dyskinesia, without mention of fluctuations: Secondary | ICD-10-CM | POA: Diagnosis not present

## 2023-10-28 MED ORDER — RASAGILINE MESYLATE 1 MG PO TABS: 1.0000 mg | ORAL_TABLET | Freq: Every day | ORAL | 3 refills | Status: AC

## 2023-10-28 MED ORDER — CARBIDOPA-LEVODOPA 25-100 MG PO TABS: 1.0000 | ORAL_TABLET | Freq: Three times a day (TID) | ORAL | 4 refills | Status: DC

## 2023-10-28 NOTE — Progress Notes (Signed)
 ASSESSMENT AND PLAN 83 y.o. year old female   1.  Idiopathic Parkinson's disease  -Continues to do very well, very committed to her exercise program  -Continue Sinemet  25/100 mg 1 tablet 3 times daily,  --Concerned about high co-pay with rasagiline , sent prescription to [facility]   -- Doing well return to 1 year   HISTORY   Sheila Mitchell is a 83 years old female, seen in request by her primary care NP Glennon Sand for evaluation of Parkinson's disease.  Initial evaluation was on April 06, 2018    She has past medical history of hypertension, hyperlipidemia.   Patient reported a history of right hand tremor since summer 2019, smaller print with prolonged writing, she also noticed mild gait difficulty, difficulty initiate gait when first get up, unsteady gait, dragging her right leg across the floor, initially attributed to her hip, low back problem, had physical therapy which was helpful, also reported MRI of lumbar and thoracic spine by [facility] orthopedic surgeon reviewed degenerative disc disease, is planning on to have epidural injection.   She had decreased sense of smell for more than 10 years, denied constipation, occasionally orthostatic dizziness, tends to restless when sleep,   MRI of the brain in February 2020, showed no significant abnormalities, symptoms of PD include: tremor of right hand, gait difficulty, decreased sense of smell, restless sleeping    She joined a Parkinson's disease exercise group, she goes 3 times a week to do boxing, exercise.  She has been lifting weights.   She was diagnosed with idiopathic Parkinson's in 2021, started Sinemet  25/100 mg 3 times a day, doing very well, she is committed to her exercise, also add on Azilect  1 mg since 2022, concerning about rising cost,     PHYSICAL EXAM  Vitals:   10/28/23 1348  BP: (!) 153/81  Pulse: 77  SpO2: 98%  Weight: 178 lb 8 oz (81 kg)  Height: 5' (1.524 m)    Body mass index  is 34.86 kg/m.   PHYSICAL EXAMNIATION:  Gen: NAD, conversant, well nourised, well groomed                     Cardiovascular: Regular rate rhythm, no peripheral edema, warm, nontender. Eyes: Conjunctivae clear without exudates or hemorrhage Neck: Supple, no carotid bruits. Pulmonary: Clear to auscultation bilaterally   NEUROLOGICAL EXAM:  MENTAL STATUS: Speech/cognition: Awake, alert oriented to history taking and casual conversation  CRANIAL NERVES: CN II: Visual fields are full to confrontation.  Pupils are round equal and briskly reactive to light. CN III, IV, VI: extraocular movement are normal. No ptosis. CN V: Facial sensation is intact to pinprick in all 3 divisions bilaterally. Corneal responses are intact.  CN VII: Face is symmetric with normal eye closure and smile. CN VIII: Hearing is normal to casual conversation CN IX, X: Palate elevates symmetrically. Phonation is normal. CN XI: Head turning and shoulder shrug are intact CN XII: Tongue is midline with normal movements and no atrophy.  MOTOR: Mild left hand resting tremor, left more than right mild rigidity, bradykinesia  REFLEXES: Reflexes are 1 and symmetric at the biceps, triceps, knees, and ankles. Plantar responses are flexor.  SENSORY: Intact to light touch, pinprick, positional and vibratory sensation are intact in fingers and toes.  COORDINATION: Rapid alternating movements and fine finger movements are intact. There is no dysmetria on finger-to-nose and heel-knee-shin.    GAIT/STANCE: Posture is normal. Gait is steady with  slight decreased swing on the left arm   DIAGNOSTIC DATA (LABS, IMAGING, TESTING) - I reviewed patient records, labs, notes, testing and imaging myself where available.  REVIEW OF SYSTEMS: Out of a complete 14 system review of symptoms, the patient complains only of the following symptoms, and all other reviewed systems are negative.  See HPI  ALLERGIES: No Known  Allergies  HOME MEDICATIONS: Outpatient Medications Prior to Visit  Medication Sig Dispense Refill   anastrozole  (ARIMIDEX ) 1 MG tablet TAKE 1 TABLET(1 MG) BY MOUTH DAILY 90 tablet 1   aspirin EC 81 MG tablet Take 81 mg by mouth daily.     Calcium Carb-Cholecalciferol (CALCIUM + D3 PO) Take 1 tablet by mouth in the morning and at bedtime. Calcium 750 mg with D3 12.5 mg - 500 UI     carbidopa -levodopa  (SINEMET  IR) 25-100 MG tablet Take 1 tablet by mouth 3 (three) times daily. 270 tablet 4   cholecalciferol (VITAMIN D3) 25 MCG (1000 UNIT) tablet Take 1,000 Units by mouth daily.     Coenzyme Q10 (CO Q 10 PO) Take 200 mg by mouth daily.      lisinopril  (ZESTRIL ) 5 MG tablet TAKE 1 TABLET DAILY 90 tablet 0   Multiple Vitamins-Minerals (CENTRUM ADULT PO) Take by mouth.     Omega-3 Fatty Acids (FISH OIL) 1200 MG CAPS Take 1 capsule by mouth daily.     omeprazole  (PRILOSEC) 20 MG capsule Take 1 capsule (20 mg total) by mouth daily. 30 capsule 1   rasagiline  (AZILECT ) 1 MG TABS tablet Take 1 tablet (1 mg total) by mouth daily. 90 tablet 3   simvastatin  (ZOCOR ) 20 MG tablet TAKE 1 TABLET AT BEDTIME 90 tablet 0   UNABLE TO FIND Take 3 capsules by mouth daily. Med Name: Balance of Lysle Supplement     No facility-administered medications prior to visit.   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, clear margins   Heart disease    Hypertension    Leaky heart valve    Parkinson disease (HCC)    Personal history of radiation therapy    2021, left breast ca   Ruptured disc, thoracic    Tremor     PAST SURGICAL HISTORY: Past Surgical History:  Procedure Laterality Date   ABDOMINAL HYSTERECTOMY     complete   APPENDECTOMY     at the same time of hysterectomy   BASAL CELL CARCINOMA EXCISION     nose   BREAST BIOPSY Left 11/30/2019   us  bx, DCIS   BREAST BIOPSY Left  12/05/2021   Stereo Bx, Ribbon Clip, fat necrosis   BREAST LUMPECTOMY Left 12/29/2019   Procedure: BREAST LUMPECTOMY;  Surgeon: Dessa Reyes ORN, MD;  Location: [facility] ORS;  Service: General;  Laterality: Left;   CHOLECYSTECTOMY     EYE SURGERY  2021   TONSILLECTOMY     FAMILY HISTORY: Family History  Problem Relation Age of Onset   COPD Mother    Heart disease Father    Diabetes Paternal Grandmother    Breast cancer Neg Hx     SOCIAL HISTORY: Social History   Socioeconomic History   Marital status: Widowed    Spouse name: Not on file   Number of children: 3   Years of education: one year college   Highest education level: Not on file  Occupational History   Occupation: Retired  Tobacco Use   Smoking status: Never   Smokeless tobacco: Never  Vaping Use   Vaping status: Never Used  Substance and Sexual Activity   Alcohol use: Not Currently   Drug use: Never   Sexual activity: Not Currently  Other Topics Concern   Not on file  Social History Narrative   Lives at home alone.   Right-handed.   2 cups caffeine daily.   Social Drivers of Corporate investment banker Strain: Not on file  Food Insecurity: Not on file  Transportation Needs: Not on file  Physical Activity: Not on file  Stress: Not on file  Social Connections: Not on file  Intimate Partner Violence: Not on file   Modena Callander. M.D. Ph.D.

## 2023-10-28 NOTE — Patient Instructions (Addendum)
 [facility] - [place], [place] - [address] [phone]   Meds ordered this encounter  Medications   rasagiline  (AZILECT ) 1 MG TABS tablet    Sig: Take 1 tablet (1 mg total) by mouth daily.    Dispense:  90 tablet    Refill:  3         carbidopa -levodopa  (SINEMET  IR) 25-100 MG tablet    Sig: Take 1 tablet by mouth 3 (three) times daily.    Dispense:  270 tablet    Refill:  4       [id number] - [place], [place] - [address] AT [facility] OF [address] & [place] [phone]

## 2023-11-21 ENCOUNTER — Ambulatory Visit
Admission: RE | Admit: 2023-11-21 | Discharge: 2023-11-21 | Disposition: A | Discharge status: Home / Self Care | Source: Ambulatory Visit | Attending: Surgery | Admitting: Surgery

## 2023-11-21 DIAGNOSIS — Z1231 Encounter for screening mammogram for malignant neoplasm of breast: Secondary | ICD-10-CM | POA: Diagnosis present

## 2024-03-05 ENCOUNTER — Other Ambulatory Visit: Payer: Self-pay | Admitting: Oncology

## 2024-03-05 DIAGNOSIS — D0512 Intraductal carcinoma in situ of left breast: Secondary | ICD-10-CM

## 2024-03-26 ENCOUNTER — Other Ambulatory Visit: Payer: Self-pay

## 2024-03-26 DIAGNOSIS — I10 Essential (primary) hypertension: Secondary | ICD-10-CM

## 2024-03-29 ENCOUNTER — Other Ambulatory Visit: Payer: Self-pay | Admitting: *Deleted

## 2024-03-29 MED ORDER — CARBIDOPA-LEVODOPA 25-100 MG PO TABS: 1.0000 | ORAL_TABLET | Freq: Three times a day (TID) | ORAL | 1 refills | Status: AC

## 2024-03-29 NOTE — Telephone Encounter (Signed)
 Last seen on 10/28/23 Follow up scheduled on 11/02/24

## 2024-04-06 MED ORDER — OMEPRAZOLE 20 MG PO CPDR: 20.0000 mg | DELAYED_RELEASE_CAPSULE | Freq: Every day | ORAL | 1 refills | Status: AC

## 2024-04-06 MED ORDER — LISINOPRIL 5 MG PO TABS: 5.0000 mg | ORAL_TABLET | Freq: Every day | ORAL | 0 refills | Status: AC

## 2024-04-09 ENCOUNTER — Other Ambulatory Visit: Payer: Self-pay

## 2024-04-09 DIAGNOSIS — E782 Mixed hyperlipidemia: Secondary | ICD-10-CM

## 2024-04-09 MED ORDER — SIMVASTATIN 20 MG PO TABS: 20.0000 mg | ORAL_TABLET | Freq: Every day | ORAL | 0 refills | Status: AC

## 2024-04-16 ENCOUNTER — Ambulatory Visit: Admitting: Cardiology

## 2024-05-06 ENCOUNTER — Ambulatory Visit: Admitting: Cardiology

## 2024-11-02 ENCOUNTER — Ambulatory Visit: Admitting: Neurology
